# Patient Record
Sex: Female | Born: 1965 | Race: White | Hispanic: No | Smoking: Never smoker
Health system: Southern US, Community
[De-identification: ages and names within clinical notes are randomized; demographics above are authoritative.]

## PROBLEM LIST (undated history)

## (undated) DIAGNOSIS — K8020 Calculus of gallbladder without cholecystitis without obstruction: Secondary | ICD-10-CM

## (undated) DIAGNOSIS — D696 Thrombocytopenia, unspecified: Secondary | ICD-10-CM

## (undated) DIAGNOSIS — Z87442 Personal history of urinary calculi: Secondary | ICD-10-CM

## (undated) DIAGNOSIS — R0600 Dyspnea, unspecified: Secondary | ICD-10-CM

## (undated) DIAGNOSIS — M722 Plantar fascial fibromatosis: Secondary | ICD-10-CM

## (undated) DIAGNOSIS — E559 Vitamin D deficiency, unspecified: Secondary | ICD-10-CM

## (undated) DIAGNOSIS — K219 Gastro-esophageal reflux disease without esophagitis: Secondary | ICD-10-CM

## (undated) DIAGNOSIS — R7303 Prediabetes: Secondary | ICD-10-CM

## (undated) DIAGNOSIS — I10 Essential (primary) hypertension: Secondary | ICD-10-CM

## (undated) DIAGNOSIS — E669 Obesity, unspecified: Secondary | ICD-10-CM

## (undated) DIAGNOSIS — N3281 Overactive bladder: Secondary | ICD-10-CM

## (undated) DIAGNOSIS — E785 Hyperlipidemia, unspecified: Secondary | ICD-10-CM

## (undated) DIAGNOSIS — L987 Excessive and redundant skin and subcutaneous tissue: Secondary | ICD-10-CM

## (undated) DIAGNOSIS — C801 Malignant (primary) neoplasm, unspecified: Secondary | ICD-10-CM

## (undated) HISTORY — PX: PRE-MALIGNANT / BENIGN SKIN LESION EXCISION: SHX160

---

## 2004-02-12 ENCOUNTER — Other Ambulatory Visit: Payer: Self-pay

## 2013-12-06 ENCOUNTER — Encounter: Payer: Self-pay | Admitting: Podiatry

## 2013-12-10 ENCOUNTER — Encounter: Payer: Self-pay | Admitting: Podiatry

## 2013-12-10 ENCOUNTER — Ambulatory Visit (INDEPENDENT_AMBULATORY_CARE_PROVIDER_SITE_OTHER): Payer: BC Managed Care – PPO | Admitting: Podiatry

## 2013-12-10 ENCOUNTER — Ambulatory Visit (INDEPENDENT_AMBULATORY_CARE_PROVIDER_SITE_OTHER): Payer: BC Managed Care – PPO

## 2013-12-10 VITALS — BP 136/84 | HR 76 | Resp 16

## 2013-12-10 DIAGNOSIS — M79671 Pain in right foot: Secondary | ICD-10-CM

## 2013-12-10 DIAGNOSIS — M722 Plantar fascial fibromatosis: Secondary | ICD-10-CM

## 2013-12-10 DIAGNOSIS — M79672 Pain in left foot: Secondary | ICD-10-CM

## 2013-12-10 DIAGNOSIS — M79609 Pain in unspecified limb: Secondary | ICD-10-CM

## 2013-12-10 MED ORDER — METHYLPREDNISOLONE (PAK) 4 MG PO TABS: ORAL_TABLET | ORAL | Status: DC

## 2013-12-10 MED ORDER — MELOXICAM 15 MG PO TABS: 15.0000 mg | ORAL_TABLET | Freq: Every day | ORAL | Status: DC

## 2013-12-10 NOTE — Patient Instructions (Signed)
Plantar Fasciitis (Heel Spur Syndrome) with Rehab The plantar fascia is a fibrous, ligament-like, soft-tissue structure that spans the bottom of the foot. Plantar fasciitis is a condition that causes pain in the foot due to inflammation of the tissue. SYMPTOMS   Pain and tenderness on the underneath side of the foot.  Pain that worsens with standing or walking. CAUSES  Plantar fasciitis is caused by irritation and injury to the plantar fascia on the underneath side of the foot. Common mechanisms of injury include:  Direct trauma to bottom of the foot.  Damage to a small nerve that runs under the foot where the main fascia attaches to the heel bone.  Stress placed on the plantar fascia due to bone spurs. RISK INCREASES WITH:   Activities that place stress on the plantar fascia (running, jumping, pivoting, or cutting).  Poor strength and flexibility.  Improperly fitted shoes.  Tight calf muscles.  Flat feet.  Failure to warm-up properly before activity.  Obesity. PREVENTION  Warm up and stretch properly before activity.  Allow for adequate recovery between workouts.  Maintain physical fitness:  Strength, flexibility, and endurance.  Cardiovascular fitness.  Maintain a health body weight.  Avoid stress on the plantar fascia.  Wear properly fitted shoes, including arch supports for individuals who have flat feet. PROGNOSIS  If treated properly, then the symptoms of plantar fasciitis usually resolve without surgery. However, occasionally surgery is necessary. RELATED COMPLICATIONS   Recurrent symptoms that may result in a chronic condition.  Problems of the lower back that are caused by compensating for the injury, such as limping.  Pain or weakness of the foot during push-off following surgery.  Chronic inflammation, scarring, and partial or complete fascia tear, occurring more often from repeated injections. TREATMENT  Treatment initially involves the use of  ice and medication to help reduce pain and inflammation. The use of strengthening and stretching exercises may help reduce pain with activity, especially stretches of the Achilles tendon. These exercises may be performed at home or with a therapist. Your caregiver may recommend that you use heel cups of arch supports to help reduce stress on the plantar fascia. Occasionally, corticosteroid injections are given to reduce inflammation. If symptoms persist for greater than 6 months despite non-surgical (conservative), then surgery may be recommended.  MEDICATION   If pain medication is necessary, then nonsteroidal anti-inflammatory medications, such as aspirin and ibuprofen, or other minor pain relievers, such as acetaminophen, are often recommended.  Do not take pain medication within 7 days before surgery.  Prescription pain relievers may be given if deemed necessary by your caregiver. Use only as directed and only as much as you need.  Corticosteroid injections may be given by your caregiver. These injections should be reserved for the most serious cases, because they may only be given a certain number of times. HEAT AND COLD  Cold treatment (icing) relieves pain and reduces inflammation. Cold treatment should be applied for 10 to 15 minutes every 2 to 3 hours for inflammation and pain and immediately after any activity that aggravates your symptoms. Use ice packs or massage the area with a piece of ice (ice massage).  Heat treatment may be used prior to performing the stretching and strengthening activities prescribed by your caregiver, physical therapist, or athletic trainer. Use a heat pack or soak the injury in warm water. SEEK IMMEDIATE MEDICAL CARE IF:  Treatment seems to offer no benefit, or the condition worsens.  Any medications produce adverse side effects. EXERCISES RANGE   OF MOTION (ROM) AND STRETCHING EXERCISES - Plantar Fasciitis (Heel Spur Syndrome) These exercises may help you  when beginning to rehabilitate your injury. Your symptoms may resolve with or without further involvement from your physician, physical therapist or athletic trainer. While completing these exercises, remember:   Restoring tissue flexibility helps normal motion to return to the joints. This allows healthier, less painful movement and activity.  An effective stretch should be held for at least 30 seconds.  A stretch should never be painful. You should only feel a gentle lengthening or release in the stretched tissue. RANGE OF MOTION - Toe Extension, Flexion  Sit with your right / left leg crossed over your opposite knee.  Grasp your toes and gently pull them back toward the top of your foot. You should feel a stretch on the bottom of your toes and/or foot.  Hold this stretch for __________ seconds.  Now, gently pull your toes toward the bottom of your foot. You should feel a stretch on the top of your toes and or foot.  Hold this stretch for __________ seconds. Repeat __________ times. Complete this stretch __________ times per day.  RANGE OF MOTION - Ankle Dorsiflexion, Active Assisted  Remove shoes and sit on a chair that is preferably not on a carpeted surface.  Place right / left foot under knee. Extend your opposite leg for support.  Keeping your heel down, slide your right / left foot back toward the chair until you feel a stretch at your ankle or calf. If you do not feel a stretch, slide your bottom forward to the edge of the chair, while still keeping your heel down.  Hold this stretch for __________ seconds. Repeat __________ times. Complete this stretch __________ times per day.  STRETCH  Gastroc, Standing  Place hands on wall.  Extend right / left leg, keeping the front knee somewhat bent.  Slightly point your toes inward on your back foot.  Keeping your right / left heel on the floor and your knee straight, shift your weight toward the wall, not allowing your back to  arch.  You should feel a gentle stretch in the right / left calf. Hold this position for __________ seconds. Repeat __________ times. Complete this stretch __________ times per day. STRETCH  Soleus, Standing  Place hands on wall.  Extend right / left leg, keeping the other knee somewhat bent.  Slightly point your toes inward on your back foot.  Keep your right / left heel on the floor, bend your back knee, and slightly shift your weight over the back leg so that you feel a gentle stretch deep in your back calf.  Hold this position for __________ seconds. Repeat __________ times. Complete this stretch __________ times per day. STRETCH  Gastrocsoleus, Standing  Note: This exercise can place a lot of stress on your foot and ankle. Please complete this exercise only if specifically instructed by your caregiver.   Place the ball of your right / left foot on a step, keeping your other foot firmly on the same step.  Hold on to the wall or a rail for balance.  Slowly lift your other foot, allowing your body weight to press your heel down over the edge of the step.  You should feel a stretch in your right / left calf.  Hold this position for __________ seconds.  Repeat this exercise with a slight bend in your right / left knee. Repeat __________ times. Complete this stretch __________ times per day.    STRENGTHENING EXERCISES - Plantar Fasciitis (Heel Spur Syndrome)  These exercises may help you when beginning to rehabilitate your injury. They may resolve your symptoms with or without further involvement from your physician, physical therapist or athletic trainer. While completing these exercises, remember:   Muscles can gain both the endurance and the strength needed for everyday activities through controlled exercises.  Complete these exercises as instructed by your physician, physical therapist or athletic trainer. Progress the resistance and repetitions only as guided. STRENGTH - Towel  Curls  Sit in a chair positioned on a non-carpeted surface.  Place your foot on a towel, keeping your heel on the floor.  Pull the towel toward your heel by only curling your toes. Keep your heel on the floor.  If instructed by your physician, physical therapist or athletic trainer, add ____________________ at the end of the towel. Repeat __________ times. Complete this exercise __________ times per day. STRENGTH - Ankle Inversion  Secure one end of a rubber exercise band/tubing to a fixed object (table, pole). Loop the other end around your foot just before your toes.  Place your fists between your knees. This will focus your strengthening at your ankle.  Slowly, pull your big toe up and in, making sure the band/tubing is positioned to resist the entire motion.  Hold this position for __________ seconds.  Have your muscles resist the band/tubing as it slowly pulls your foot back to the starting position. Repeat __________ times. Complete this exercises __________ times per day.  Document Released: 12/13/2005 Document Revised: 03/06/2012 Document Reviewed: 03/27/2009 [employer] Patient Information 2014 [employer]. Plantar Fasciitis Plantar fasciitis is a common condition that causes foot pain. It is soreness (inflammation) of the band of tough fibrous tissue on the bottom of the foot that runs from the heel bone (calcaneus) to the ball of the foot. The cause of this soreness may be from excessive standing, poor fitting shoes, running on hard surfaces, being overweight, having an abnormal walk, or overuse (this is common in runners) of the painful foot or feet. It is also common in aerobic exercise dancers and ballet dancers. SYMPTOMS  Most people with plantar fasciitis complain of:  Severe pain in the morning on the bottom of their foot especially when taking the first steps out of bed. This pain recedes after a few minutes of walking.  Severe pain is experienced also during walking  following a long period of inactivity.  Pain is worse when walking barefoot or up stairs DIAGNOSIS   Your caregiver will diagnose this condition by examining and feeling your foot.  Special tests such as X-rays of your foot, are usually not needed. PREVENTION   Consult a sports medicine professional before beginning a new exercise program.  Walking programs offer a good workout. With walking there is a lower chance of overuse injuries common to runners. There is less impact and less jarring of the joints.  Begin all new exercise programs slowly. If problems or pain develop, decrease the amount of time or distance until you are at a comfortable level.  Wear good shoes and replace them regularly.  Stretch your foot and the heel cords at the back of the ankle (Achilles tendon) both before and after exercise.  Run or exercise on even surfaces that are not hard. For example, asphalt is better than pavement.  Do not run barefoot on hard surfaces.  If using a treadmill, vary the incline.  Do not continue to workout if you have foot or joint   problems. Seek professional help if they do not improve. HOME CARE INSTRUCTIONS   Avoid activities that cause you pain until you recover.  Use ice or cold packs on the problem or painful areas after working out.  Only take over-the-counter or prescription medicines for pain, discomfort, or fever as directed by your caregiver.  Soft shoe inserts or athletic shoes with air or gel sole cushions may be helpful.  If problems continue or become more severe, consult a sports medicine caregiver or your own health care provider. Cortisone is a potent anti-inflammatory medication that may be injected into the painful area. You can discuss this treatment with your caregiver. MAKE SURE YOU:   Understand these instructions.  Will watch your condition.  Will get help right away if you are not doing well or get worse. Document Released: 09/07/2001 Document  Revised: 03/06/2012 Document Reviewed: 11/06/2008 Exit[employer] Patient Information 2014 [employer].  

## 2013-12-10 NOTE — Progress Notes (Signed)
   Subjective:    Patient ID: Jasmine Franco, female    DOB: Mar 28, 1966, 47 y.o.   MRN: [id number]  HPI Comments: The bottom and top of feet hurt N throbbing pain   L bilateral feet plantar heel and top of foot  D 6 months  O gradual  C worse  A first step up after resting  T ibuprofen    Right great toenail possible fungus, it came off and grew back funny and now its the second toenail and seems to be thicker than the others  Foot Pain      Review of Systems  Constitutional:       Weight change  Musculoskeletal:       Difficulty walking   Skin:       Change in nails   Hematological: Bruises/bleeds easily.  All other systems reviewed and are negative.       Objective:   Physical Exam: I have reviewed her past medical history medications allergies surgeries social history and review of systems. Vital signs are stable she is alert and oriented x3. Pulses are strongly palpable bilateral. Deep tendon reflexes are palpable bilateral. Neurologic sensorium is intact per since once the monofilament. Muscle strength is 5 over 5 dorsiflexors plantar flexors inverters everters all intrinsic musculature is intact. Orthopedic evaluation demonstrates mild tenderness on palpation medial continued tubercles bilateral. Radiographic evaluation does demonstrate soft tissue increase in density at the plantar fascial calcaneal insertion site. Cutaneous evaluation demonstrates supple well hydrated cutis no erythema edema cellulitis drainage or odor.        Assessment & Plan:  Assessment: Plantar fasciitis bilateral.  Plan: We discussed the etiology pathology conservative versus surgical therapies. At this point she is allergic to cortisone so we did not inject her bilateral heels instead we applied a plantar fascial strapping to the bilateral foot. She was scanned for a pair orthotics. She was written a prescription for prednisone as well as Mobic. We discussed appropriate shoe gear  stretching exercises ice therapy and shoe gear modifications. I will followup with her in the near future appear

## 2014-01-15 ENCOUNTER — Telehealth: Payer: Self-pay | Admitting: *Deleted

## 2014-01-15 ENCOUNTER — Other Ambulatory Visit: Payer: Self-pay | Admitting: Podiatry

## 2014-01-15 MED ORDER — ETODOLAC ER 400 MG PO TB24: 400.0000 mg | ORAL_TABLET | Freq: Every day | ORAL | Status: DC

## 2014-01-15 NOTE — Telephone Encounter (Signed)
CALLED AND SPOKE WITH PT LETTING HER KNOW THAT THE ETODOLAC WAS SENT IN TO PHARMACY.

## 2014-01-15 NOTE — Telephone Encounter (Signed)
Pt called said she has been taking mobic for 1 month and it is not helping. Wants to go ahead and try something different such as etodolac sent [facility] [address]. Is this ok?

## 2014-02-04 ENCOUNTER — Encounter: Payer: Self-pay | Admitting: Podiatry

## 2014-06-10 ENCOUNTER — Other Ambulatory Visit: Payer: Self-pay | Admitting: *Deleted

## 2014-06-10 MED ORDER — ETODOLAC ER 400 MG PO TB24: 400.0000 mg | ORAL_TABLET | Freq: Every day | ORAL | Status: DC

## 2014-10-31 ENCOUNTER — Other Ambulatory Visit: Payer: Self-pay | Admitting: *Deleted

## 2014-10-31 MED ORDER — ETODOLAC ER 400 MG PO TB24: 400.0000 mg | ORAL_TABLET | Freq: Every day | ORAL | Status: DC

## 2014-10-31 NOTE — Telephone Encounter (Signed)
[facility] [address] sent refill request for etodolac 400 mg #30 with 3 refills. Per dr Milinda Pointer refill.

## 2015-03-03 ENCOUNTER — Other Ambulatory Visit: Payer: Self-pay | Admitting: *Deleted

## 2015-03-03 MED ORDER — ETODOLAC ER 400 MG PO TB24: 400.0000 mg | ORAL_TABLET | Freq: Every day | ORAL | Status: DC

## 2015-03-03 NOTE — Telephone Encounter (Signed)
Faxed refill request received. Refill medication , patient needs to follow up with Dr Milinda Pointer

## 2015-04-16 ENCOUNTER — Ambulatory Visit (INDEPENDENT_AMBULATORY_CARE_PROVIDER_SITE_OTHER): Payer: BLUE CROSS/BLUE SHIELD

## 2015-04-16 ENCOUNTER — Ambulatory Visit (INDEPENDENT_AMBULATORY_CARE_PROVIDER_SITE_OTHER): Payer: BLUE CROSS/BLUE SHIELD | Admitting: Podiatry

## 2015-04-16 VITALS — BP 161/102 | HR 78 | Resp 16 | Ht 64.0 in | Wt 235.0 lb

## 2015-04-16 DIAGNOSIS — M722 Plantar fascial fibromatosis: Secondary | ICD-10-CM

## 2015-04-16 DIAGNOSIS — M79673 Pain in unspecified foot: Secondary | ICD-10-CM

## 2015-04-16 MED ORDER — ETODOLAC ER 400 MG PO TB24: 400.0000 mg | ORAL_TABLET | Freq: Every day | ORAL | Status: DC

## 2015-04-16 NOTE — Progress Notes (Signed)
She presents today for follow-up of her capsulitis and fasciitis bilateral. She states that when she takes the etodolac she feels good and is able to get around and do what she wants to do.  Objective: Vital signs are stable she is alert and oriented 3. Pulses are palpable bilateral. Hallux valgus disorder is also noted she has pain on palpation second metatarsophalangeal joints bilaterally and pain on palpation plantar fascial calcaneal insertion site bilateral.  Assessment: Plantar fasciitis with compensatory capsulitis second metatarsophalangeal joint hallux valgus disorders also noted.  Plan: Discussed the etiology pathology conservative versus surgical therapies at this point I went ahead and refill her etodolac and will follow up with her on an as-needed basis for surgical intervention.

## 2015-06-23 ENCOUNTER — Other Ambulatory Visit: Payer: Self-pay | Admitting: Podiatry

## 2015-06-23 NOTE — Telephone Encounter (Signed)
Pt will need to schedule an appt if problems continue.

## 2015-09-24 ENCOUNTER — Other Ambulatory Visit: Payer: Self-pay | Admitting: *Deleted

## 2015-09-24 MED ORDER — ETODOLAC ER 400 MG PO TB24: ORAL_TABLET | ORAL | Status: DC

## 2015-09-24 NOTE — Telephone Encounter (Signed)
Etodolac refilled 

## 2015-12-11 ENCOUNTER — Telehealth: Payer: Self-pay | Admitting: *Deleted

## 2015-12-11 MED ORDER — ETODOLAC ER 400 MG PO TB24: ORAL_TABLET | ORAL | Status: DC

## 2015-12-11 NOTE — Telephone Encounter (Signed)
Fax refill request for Etodolac SA 400Mg .  Dr. Milinda Pointer ordered refill as previously. Done.

## 2016-03-26 ENCOUNTER — Telehealth: Payer: Self-pay | Admitting: *Deleted

## 2016-03-26 MED ORDER — ETODOLAC ER 400 MG PO TB24: ORAL_TABLET | ORAL | Status: DC

## 2016-03-26 NOTE — Telephone Encounter (Signed)
Faxed refill request for Etodolac SA.  Dr. Milinda Pointer states refill once to get pt in for an appt.

## 2016-04-24 ENCOUNTER — Other Ambulatory Visit: Payer: Self-pay | Admitting: Podiatry

## 2016-05-03 ENCOUNTER — Other Ambulatory Visit: Payer: Self-pay | Admitting: Podiatry

## 2016-05-04 ENCOUNTER — Telehealth: Payer: Self-pay | Admitting: *Deleted

## 2016-05-04 NOTE — Telephone Encounter (Signed)
Pt called for refill of the Lodine.  I explained to pt, the medication was refilled in 02/2016, and she had been instructed to make an appt if continuing to have pain.  Pt asked how much it would cost and I told her it would depend on the problem, her insurance and treatments needed. Pt states she may call back.

## 2016-05-26 ENCOUNTER — Telehealth: Payer: Self-pay | Admitting: *Deleted

## 2016-05-26 MED ORDER — ETODOLAC ER 400 MG PO TB24: ORAL_TABLET | ORAL | Status: DC

## 2016-05-26 NOTE — Telephone Encounter (Signed)
Marcy Siren I do not get into guessing how much a patient's visit is going to cost. I do not know what the doctor will decide to do and I am not going to have a patient say I said the visit would be. So that is not something I do. If it is custom orthotics, darco shoe or some product we know is not covered I will help with that, but not what a visit may cost before a patient see a doctor.Marland Kitchen

## 2016-05-26 NOTE — Telephone Encounter (Signed)
May give a 6 month refill but needs to be seen by [place] or pcp to reperscribe a that date.

## 2016-05-26 NOTE — Telephone Encounter (Addendum)
Pt request refill of Lodine.  Dr. Milinda Pointer states refill for 6 months and if additional are needed pt is to go to PCP or make an appt here.  Informed pt.

## 2016-11-14 ENCOUNTER — Other Ambulatory Visit: Payer: Self-pay | Admitting: Podiatry

## 2016-12-01 ENCOUNTER — Ambulatory Visit (INDEPENDENT_AMBULATORY_CARE_PROVIDER_SITE_OTHER): Payer: BLUE CROSS/BLUE SHIELD | Admitting: Podiatry

## 2016-12-01 VITALS — BP 147/86 | HR 71

## 2016-12-01 DIAGNOSIS — M7751 Other enthesopathy of right foot: Secondary | ICD-10-CM

## 2016-12-01 DIAGNOSIS — Q828 Other specified congenital malformations of skin: Secondary | ICD-10-CM | POA: Diagnosis not present

## 2016-12-01 DIAGNOSIS — M779 Enthesopathy, unspecified: Secondary | ICD-10-CM

## 2016-12-01 DIAGNOSIS — M778 Other enthesopathies, not elsewhere classified: Secondary | ICD-10-CM

## 2016-12-01 DIAGNOSIS — M722 Plantar fascial fibromatosis: Secondary | ICD-10-CM

## 2016-12-01 MED ORDER — ETODOLAC ER 400 MG PO TB24: ORAL_TABLET | ORAL | 4 refills | Status: DC

## 2016-12-01 NOTE — Progress Notes (Signed)
She presents today with chief complaint of a painful porokeratotic lesion to the plantar lateral aspect of the right foot as well as pain to the dorsal aspect of the foot..  Objective: Vital signs stable alert and oriented 3. She has pain on palpation myofascial pain insertion site of the right foot she has a painful porokeratotic lesion sub-fifth metatarsal base of the right foot.  Plan: Refilled her etodolac debrided the reactive hyperkeratotic lesion and placed Cantharone under occlusion to be washed off tomorrow. I will follow-up with her as needed.

## 2017-01-11 ENCOUNTER — Telehealth: Payer: Self-pay | Admitting: Podiatry

## 2017-01-11 MED ORDER — ETODOLAC ER 400 MG PO TB24: ORAL_TABLET | ORAL | 4 refills | Status: DC

## 2017-01-11 MED ORDER — ETODOLAC ER 400 MG PO TB24
ORAL_TABLET | ORAL | 4 refills | Status: DC
Start: 2017-01-11 — End: 2017-01-27

## 2017-01-11 NOTE — Addendum Note (Signed)
Addended by: Harriett Sine D on: 01/11/2017 03:15 PM   Modules accepted: Orders

## 2017-01-11 NOTE — Telephone Encounter (Signed)
Jasmine Franco said she has left several messages that her RX needs to be change to E strips and havent received a response. [facility].

## 2017-01-19 ENCOUNTER — Telehealth: Payer: Self-pay | Admitting: *Deleted

## 2017-01-19 NOTE — Telephone Encounter (Addendum)
Pt's message started with DOB, and phone number, both were given very quickly and pt did not leave a message.  01/19/2017-I spoke with pt and she said [facility] and if Dr.Hyatt could write her for something cheaper then she would like that because she is having trouble with her insurance. Pt states if he can't write for a cheaper medication then she will need the [facility]ac [facility] the [id number] called for the status of the Etodolac. I told her I did not get a cheaper medication and asked if she wanted me to [facility] [address] [facility] and she said yes.

## 2017-01-27 MED ORDER — ETODOLAC ER 400 MG PO TB24: ORAL_TABLET | ORAL | 4 refills | Status: DC

## 2018-03-01 ENCOUNTER — Telehealth: Payer: Self-pay | Admitting: Podiatry

## 2018-03-01 MED ORDER — ETODOLAC ER 400 MG PO TB24: ORAL_TABLET | ORAL | 0 refills | Status: DC

## 2018-03-01 NOTE — Addendum Note (Signed)
Addended by: Graceann Congress D on: 03/01/2018 11:52 AM   Modules accepted: Orders

## 2018-03-01 NOTE — Telephone Encounter (Signed)
patinet caleld requesting a refill on her anti imflammatory medicine. Would like for it to be sent to [facility] on [facility] [address]

## 2018-03-01 NOTE — Telephone Encounter (Signed)
Patient called requesting refill for Etodolac.  Patient has not been seen since Dec. 2017.  Patient informed that since she has not been seen in over 1 year that she will need to schedule follow up visit with Dr. Milinda Pointer.  One refill given today per Dr. Milinda Pointer and patient was sent to scheduling for follow up appt.

## 2018-03-15 ENCOUNTER — Encounter: Payer: Self-pay | Admitting: Podiatry

## 2018-03-15 ENCOUNTER — Ambulatory Visit (INDEPENDENT_AMBULATORY_CARE_PROVIDER_SITE_OTHER): Payer: Self-pay | Admitting: Podiatry

## 2018-03-15 DIAGNOSIS — M722 Plantar fascial fibromatosis: Secondary | ICD-10-CM

## 2018-03-15 MED ORDER — ETODOLAC ER 400 MG PO TB24: ORAL_TABLET | ORAL | 11 refills | Status: DC

## 2018-03-15 NOTE — Progress Notes (Signed)
She presents today for follow-up of her foot pain.  States that as long as she continues to take her etodolac once a day or every other day she does just fine.  Objective: No change in past medical history medications allergies surgery social history review of systems.  Assessment: Chronic pain bilateral foot.  Plan: Refill of her Relafen 400 mg 1 p.o. daily and I will follow-up with her as needed.

## 2018-11-07 ENCOUNTER — Emergency Department: Payer: Self-pay

## 2018-11-07 ENCOUNTER — Emergency Department
Admission: EM | Admit: 2018-11-07 | Discharge: 2018-11-07 | Disposition: A | Discharge status: Home / Self Care | Payer: Self-pay | Attending: Emergency Medicine | Admitting: Emergency Medicine

## 2018-11-07 ENCOUNTER — Other Ambulatory Visit: Payer: Self-pay

## 2018-11-07 DIAGNOSIS — Z79899 Other long term (current) drug therapy: Secondary | ICD-10-CM | POA: Insufficient documentation

## 2018-11-07 DIAGNOSIS — R1032 Left lower quadrant pain: Secondary | ICD-10-CM | POA: Insufficient documentation

## 2018-11-07 LAB — CBC
HCT: 39 % (ref 36.0–46.0)
Hemoglobin: 12.5 g/dL (ref 12.0–15.0)
MCH: 28.7 pg (ref 26.0–34.0)
MCHC: 32.1 g/dL (ref 30.0–36.0)
MCV: 89.4 fL (ref 80.0–100.0)
Platelets: 202 10*3/uL (ref 150–400)
RBC: 4.36 MIL/uL (ref 3.87–5.11)
RDW: 14.4 % (ref 11.5–15.5)
WBC: 5.4 10*3/uL (ref 4.0–10.5)
nRBC: 0 % (ref 0.0–0.2)

## 2018-11-07 LAB — URINALYSIS, COMPLETE (UACMP) WITH MICROSCOPIC
Bilirubin Urine: NEGATIVE
Glucose, UA: NEGATIVE mg/dL
Hgb urine dipstick: NEGATIVE
Ketones, ur: 5 mg/dL — AB
Leukocytes, UA: NEGATIVE
Nitrite: NEGATIVE
Protein, ur: NEGATIVE mg/dL
Specific Gravity, Urine: 1.021 (ref 1.005–1.030)
pH: 5 (ref 5.0–8.0)

## 2018-11-07 LAB — COMPREHENSIVE METABOLIC PANEL
ALT: 22 U/L (ref 0–44)
AST: 20 U/L (ref 15–41)
Albumin: 3.7 g/dL (ref 3.5–5.0)
Alkaline Phosphatase: 83 U/L (ref 38–126)
Anion gap: 6 (ref 5–15)
BUN: 17 mg/dL (ref 6–20)
CO2: 27 mmol/L (ref 22–32)
Calcium: 8.9 mg/dL (ref 8.9–10.3)
Chloride: 109 mmol/L (ref 98–111)
Creatinine, Ser: 0.55 mg/dL (ref 0.44–1.00)
GFR calc Af Amer: >60 mL/min (ref >60)
GFR calc non Af Amer: >60 mL/min (ref >60)
Glucose, Bld: 96 mg/dL (ref 70–99)
Potassium: 4 mmol/L (ref 3.5–5.1)
Sodium: 142 mmol/L (ref 135–145)
Total Bilirubin: 0.6 mg/dL (ref 0.3–1.2)
Total Protein: 7.1 g/dL (ref 6.5–8.1)

## 2018-11-07 LAB — LIPASE, BLOOD: Lipase: 33 U/L (ref 11–51)

## 2018-11-07 LAB — POCT PREGNANCY, URINE: Preg Test, Ur: NEGATIVE

## 2018-11-07 MED ORDER — IOPAMIDOL (ISOVUE-300) INJECTION 61%
100.0000 mL | Freq: Once | INTRAVENOUS | Status: AC | PRN
  Administered 2018-11-07: 100 mL via INTRAVENOUS
  Filled 2018-11-07: qty 100

## 2018-11-07 MED ORDER — IOPAMIDOL (ISOVUE-300) INJECTION 61%
30.0000 mL | Freq: Once | INTRAVENOUS | Status: AC | PRN
  Administered 2018-11-07: 30 mL via ORAL
  Filled 2018-11-07: qty 30

## 2018-11-07 MED ORDER — AMOXICILLIN-POT CLAVULANATE 875-125 MG PO TABS: 1.0000 | ORAL_TABLET | Freq: Two times a day (BID) | ORAL | 0 refills | Status: AC

## 2018-11-07 MED ORDER — TRAMADOL HCL 50 MG PO TABS: 50.0000 mg | ORAL_TABLET | Freq: Four times a day (QID) | ORAL | 0 refills | Status: DC | PRN

## 2018-11-07 MED ORDER — AMOXICILLIN-POT CLAVULANATE 875-125 MG PO TABS
1.0000 | ORAL_TABLET | Freq: Once | ORAL | Status: AC
  Administered 2018-11-07: 1 via ORAL
  Filled 2018-11-07: qty 1

## 2018-11-07 NOTE — Discharge Instructions (Signed)
Take your entire course of antibiotics.  Please take pain medication as needed but only as prescribed.  Do not drink alcohol or drive while taking your pain medication.  Please follow-up with your primary care doctor within the next 1 week for recheck/reevaluation.  Return to the emergency department for any worsening pain or development of fever.

## 2018-11-07 NOTE — ED Notes (Signed)
Pt c/o LLQ pain constantly for the past week. Denies N/V/D/fever or painful urination.

## 2018-11-07 NOTE — ED Provider Notes (Signed)
[facility] Emergency Department Provider Note  Time seen: 5:10 PM  I have reviewed the triage vital signs and the nursing notes.   HISTORY  Chief Complaint Abdominal Pain    HPI Jasmine Franco is a 52 y.o. female with no significant past medical history presents to the emergency department for left lower quadrant abdominal pain x1 week.  According to the patient for the past 1 week she has been experiencing a dull constant pain 8/10 in severity states it has been constant and non-wavering.  Denies any nausea or vomiting.  Denies any diarrhea.  Denies any dysuria or hematuria.   History reviewed. No pertinent past medical history.  There are no active problems to display for this patient.   History reviewed. No pertinent surgical history.  Prior to Admission medications   Medication Sig Start Date End Date Taking? Authorizing Provider  etodolac (LODINE XL) 400 MG 24 hr tablet TAKE 1 TABLET (400 MG TOTAL) BY MOUTH DAILY. 03/15/18   Hyatt, Max T, DPM    Allergies  Allergen Reactions  . Cortisone Other (See Comments)    redness    Family History  Problem Relation Age of Onset  . Diabetes Mother     Social History Social History   Tobacco Use  . Smoking status: Never Smoker  . Smokeless tobacco: Never Used  Substance Use Topics  . Alcohol use: No  . Drug use: No    Review of Systems Constitutional: Negative for fever. Eyes: Negative for visual complaints ENT: Negative for recent illness/congestion Cardiovascular: Negative for chest pain. Respiratory: Negative for shortness of breath. Gastrointestinal: Left lower quadrant abdominal pain.  Negative for vomiting or diarrhea.  Negative for black or bloody stool.  Normal bowel movements Genitourinary: Negative for urinary compaints Musculoskeletal: Negative for musculoskeletal complaints Skin: Negative for skin complaints  Neurological: Negative for headache All other ROS  negative  ____________________________________________   PHYSICAL EXAM:  VITAL SIGNS: ED Triage Vitals  Enc Vitals Group     BP 11/07/18 1523 (!) 191/97     Pulse Rate 11/07/18 1523 76     Resp 11/07/18 1523 16     Temp 11/07/18 1523 98 F (36.7 C)     Temp Source 11/07/18 1523 Oral     SpO2 11/07/18 1523 98 %     Weight 11/07/18 1522 273 lb (123.8 kg)     Height 11/07/18 1522 5\' 3"  (1.6 m)     Head Circumference --      Peak Flow --      Pain Score 11/07/18 1522 8     Pain Loc --      Pain Edu? --      Excl. in [place]? --     Constitutional: Alert and oriented. Well appearing and in no distress. Eyes: Normal exam ENT   Head: Normocephalic and atraumatic.   Nose: No congestion/rhinnorhea.   Mouth/Throat: Mucous membranes are moist. Cardiovascular: Normal rate, regular rhythm. No murmur Respiratory: Normal respiratory effort without tachypnea nor retractions. Breath sounds are clear  Gastrointestinal: Soft, mild left lower quadrant tenderness to palpation.  No rebound guarding or distention.  Abdomen otherwise benign Musculoskeletal: Nontender with normal range of motion in all extremities.  Neurologic:  Normal speech and language. No gross focal neurologic deficits Skin:  Skin is warm, dry and intact.  Psychiatric: Mood and affect are normal.   ____________________________________________   RADIOLOGY  CT scan shows extensive diverticulosis with no definitive diverticulitis  ____________________________________________  INITIAL IMPRESSION / ASSESSMENT AND PLAN / ED COURSE  Pertinent labs & imaging results that were available during my care of the patient were reviewed by me and considered in my medical decision making (see chart for details).  Patient presents to the emergency department for 1 week of left lower quadrant abdominal pain.  Differential would include hernia, colitis, diverticulitis, ovarian pathology, UTI pyelonephritis, muscular skeletal  pain.  Labs have resulted largely within normal limits, normal white blood cell count.  However given the patient's mild tenderness to palpation with ongoing pain x1 week despite home pain medications we will obtain a CT scan to further evaluate.  Patient agreeable to plan of care.  CT largely negative besides extensive diverticulosis, no definitive diverticulitis however patient's pain is focal to the left lower quadrant which would make diverticulitis a reasonable explanation for her discomfort.  As a precaution we will treat with Augmentin at the patient follow-up with her primary care doctor.  Patient agreeable to plan of care.  ____________________________________________   FINAL CLINICAL IMPRESSION(S) / ED DIAGNOSES  Left lower quadrant abdominal pain    Harvest Dark, MD 11/07/18 2212

## 2018-11-07 NOTE — ED Triage Notes (Addendum)
C/o LLQ abd pain x 1 week. Denise N&V&D&fever. No dysuria. Still have BM. A&O, in wheelchair. No distress noted. Brought over by [facility]

## 2019-03-21 ENCOUNTER — Ambulatory Visit (INDEPENDENT_AMBULATORY_CARE_PROVIDER_SITE_OTHER): Payer: Self-pay | Admitting: Podiatry

## 2019-03-21 DIAGNOSIS — M722 Plantar fascial fibromatosis: Secondary | ICD-10-CM

## 2019-03-21 MED ORDER — ETODOLAC ER 400 MG PO TB24: ORAL_TABLET | ORAL | 3 refills | Status: DC

## 2019-03-21 NOTE — Progress Notes (Signed)
Dr. Milinda Pointer informed patient refill would be sent into her pharmacy and appointment was not necessary for today.

## 2019-04-10 ENCOUNTER — Telehealth: Payer: Self-pay

## 2019-04-10 MED ORDER — ETODOLAC ER 400 MG PO TB24: ORAL_TABLET | ORAL | 3 refills | Status: DC

## 2019-04-10 NOTE — Telephone Encounter (Signed)
Patient called requesting her refill for Lodine to go to [facility], [address] instead of [facility].   Refill has been sent to [facility].

## 2020-04-02 ENCOUNTER — Telehealth: Payer: Self-pay | Admitting: *Deleted

## 2020-04-02 MED ORDER — ETODOLAC ER 400 MG PO TB24: ORAL_TABLET | ORAL | 1 refills | Status: DC

## 2020-04-02 NOTE — Addendum Note (Signed)
Addended by: Graceann Congress D on: 04/02/2020 01:30 PM   Modules accepted: Orders

## 2020-04-02 NOTE — Telephone Encounter (Signed)
I need a refill on my medication.

## 2020-04-02 NOTE — Telephone Encounter (Signed)
Please refill her medication.  I think she is requesting the lodine.

## 2020-04-02 NOTE — Telephone Encounter (Signed)
Patient has been notified of refill and medication has been sent to pharmacy

## 2020-04-23 ENCOUNTER — Ambulatory Visit: Payer: Self-pay | Admitting: Podiatry

## 2020-05-07 ENCOUNTER — Ambulatory Visit (INDEPENDENT_AMBULATORY_CARE_PROVIDER_SITE_OTHER): Payer: Self-pay | Admitting: Podiatry

## 2020-05-07 ENCOUNTER — Encounter: Payer: Self-pay | Admitting: Podiatry

## 2020-05-07 ENCOUNTER — Other Ambulatory Visit: Payer: Self-pay

## 2020-05-07 DIAGNOSIS — M722 Plantar fascial fibromatosis: Secondary | ICD-10-CM

## 2020-05-07 MED ORDER — ETODOLAC ER 400 MG PO TB24: ORAL_TABLET | ORAL | 3 refills | Status: DC

## 2020-05-07 NOTE — Progress Notes (Signed)
She presents today after having not seen her for couple years with chief complaint of pain in her feet.  She states that the medication etodolac usually will relieve it she has been taking one a day for the past couple of years.  Objective: Vital signs are stable she is alert and oriented x3.  There is no erythema edema cellulitis drainage or odor she has hallux valgus deformity with some osteoarthritic changes of the midfoot.  Mild pes planus is noted bilaterally.  Assessment: Osteoarthritis hallux valgus deformity.  Plan: Etodolac 400 mg one p.o. daily.  Ninety were dispensed with three refills follow-up with her in a year

## 2020-12-08 DIAGNOSIS — N3281 Overactive bladder: Secondary | ICD-10-CM | POA: Insufficient documentation

## 2020-12-08 DIAGNOSIS — M545 Low back pain, unspecified: Secondary | ICD-10-CM | POA: Insufficient documentation

## 2020-12-27 DIAGNOSIS — C801 Malignant (primary) neoplasm, unspecified: Secondary | ICD-10-CM

## 2020-12-27 HISTORY — DX: Malignant (primary) neoplasm, unspecified: C80.1

## 2021-01-26 ENCOUNTER — Other Ambulatory Visit: Payer: Self-pay | Admitting: Internal Medicine

## 2021-01-26 DIAGNOSIS — M25512 Pain in left shoulder: Secondary | ICD-10-CM

## 2021-01-26 DIAGNOSIS — R1032 Left lower quadrant pain: Secondary | ICD-10-CM

## 2021-01-26 DIAGNOSIS — G8929 Other chronic pain: Secondary | ICD-10-CM

## 2021-01-27 ENCOUNTER — Other Ambulatory Visit: Payer: Self-pay | Admitting: Internal Medicine

## 2021-01-27 DIAGNOSIS — Z1231 Encounter for screening mammogram for malignant neoplasm of breast: Secondary | ICD-10-CM

## 2021-01-27 HISTORY — PX: CHOLECYSTECTOMY: SHX55

## 2021-02-01 ENCOUNTER — Ambulatory Visit: Admission: RE | Admit: 2021-02-01 | Payer: Self-pay | Source: Ambulatory Visit

## 2021-02-09 ENCOUNTER — Ambulatory Visit: Admission: RE | Admit: 2021-02-09 | Payer: Self-pay | Source: Ambulatory Visit

## 2021-02-10 ENCOUNTER — Observation Stay: Payer: 59 | Admitting: Certified Registered"

## 2021-02-10 ENCOUNTER — Emergency Department: Payer: 59

## 2021-02-10 ENCOUNTER — Ambulatory Visit: Payer: Self-pay

## 2021-02-10 ENCOUNTER — Inpatient Hospital Stay
Admission: EM | Admit: 2021-02-10 | Discharge: 2021-02-12 | DRG: 419 | Disposition: A | Discharge status: Home / Self Care | Payer: 59 | Attending: Surgery | Admitting: Surgery

## 2021-02-10 ENCOUNTER — Other Ambulatory Visit: Payer: Self-pay

## 2021-02-10 ENCOUNTER — Encounter
Admission: EM | Disposition: A | Discharge status: Home / Self Care | Payer: Self-pay | Source: Home / Self Care | Attending: Surgery

## 2021-02-10 DIAGNOSIS — Z888 Allergy status to other drugs, medicaments and biological substances status: Secondary | ICD-10-CM

## 2021-02-10 DIAGNOSIS — Z20822 Contact with and (suspected) exposure to covid-19: Secondary | ICD-10-CM | POA: Diagnosis present

## 2021-02-10 DIAGNOSIS — K8000 Calculus of gallbladder with acute cholecystitis without obstruction: Secondary | ICD-10-CM | POA: Diagnosis not present

## 2021-02-10 DIAGNOSIS — M722 Plantar fascial fibromatosis: Secondary | ICD-10-CM | POA: Diagnosis present

## 2021-02-10 DIAGNOSIS — K819 Cholecystitis, unspecified: Secondary | ICD-10-CM | POA: Diagnosis not present

## 2021-02-10 DIAGNOSIS — R1013 Epigastric pain: Secondary | ICD-10-CM

## 2021-02-10 DIAGNOSIS — K8012 Calculus of gallbladder with acute and chronic cholecystitis without obstruction: Secondary | ICD-10-CM | POA: Diagnosis not present

## 2021-02-10 DIAGNOSIS — K8020 Calculus of gallbladder without cholecystitis without obstruction: Secondary | ICD-10-CM | POA: Diagnosis present

## 2021-02-10 DIAGNOSIS — Z833 Family history of diabetes mellitus: Secondary | ICD-10-CM

## 2021-02-10 DIAGNOSIS — N3281 Overactive bladder: Secondary | ICD-10-CM | POA: Diagnosis present

## 2021-02-10 DIAGNOSIS — I10 Essential (primary) hypertension: Secondary | ICD-10-CM | POA: Diagnosis present

## 2021-02-10 DIAGNOSIS — K810 Acute cholecystitis: Secondary | ICD-10-CM | POA: Diagnosis present

## 2021-02-10 LAB — CBC
HCT: 40.7 % (ref 36.0–46.0)
Hemoglobin: 13.3 g/dL (ref 12.0–15.0)
MCH: 29.4 pg (ref 26.0–34.0)
MCHC: 32.7 g/dL (ref 30.0–36.0)
MCV: 89.8 fL (ref 80.0–100.0)
Platelets: 203 10*3/uL (ref 150–400)
RBC: 4.53 MIL/uL (ref 3.87–5.11)
RDW: 14 % (ref 11.5–15.5)
WBC: 7.6 10*3/uL (ref 4.0–10.5)
nRBC: 0 % (ref 0.0–0.2)

## 2021-02-10 LAB — BASIC METABOLIC PANEL
Anion gap: 9 (ref 5–15)
BUN: 20 mg/dL (ref 6–20)
CO2: 27 mmol/L (ref 22–32)
Calcium: 9 mg/dL (ref 8.9–10.3)
Chloride: 105 mmol/L (ref 98–111)
Creatinine, Ser: 0.59 mg/dL (ref 0.44–1.00)
GFR, Estimated: >60 mL/min (ref >60)
Glucose, Bld: 128 mg/dL — ABNORMAL HIGH (ref 70–99)
Potassium: 4.4 mmol/L (ref 3.5–5.1)
Sodium: 141 mmol/L (ref 135–145)

## 2021-02-10 LAB — TROPONIN I (HIGH SENSITIVITY): Troponin I (High Sensitivity): 4 ng/L (ref <18)

## 2021-02-10 LAB — HEPATIC FUNCTION PANEL
ALT: 17 U/L (ref 0–44)
AST: 15 U/L (ref 15–41)
Albumin: 3.6 g/dL (ref 3.5–5.0)
Alkaline Phosphatase: 96 U/L (ref 38–126)
Bilirubin, Direct: <0.1 mg/dL (ref 0.0–0.2)
Total Bilirubin: 0.8 mg/dL (ref 0.3–1.2)
Total Protein: 7.4 g/dL (ref 6.5–8.1)

## 2021-02-10 LAB — RESP PANEL BY RT-PCR (FLU A&B, COVID) ARPGX2
Influenza A by PCR: NEGATIVE
Influenza B by PCR: NEGATIVE
SARS Coronavirus 2 by RT PCR: NEGATIVE

## 2021-02-10 LAB — LIPASE, BLOOD: Lipase: 35 U/L (ref 11–51)

## 2021-02-10 SURGERY — CHOLECYSTECTOMY, ROBOT-ASSISTED, LAPAROSCOPIC
Anesthesia: General

## 2021-02-10 MED ORDER — LORAZEPAM 2 MG/ML IJ SOLN: 1.0000 mg | Freq: Once | INTRAMUSCULAR | Status: DC | PRN

## 2021-02-10 MED ORDER — SODIUM CHLORIDE 0.9 % IV SOLN: Freq: Once | INTRAVENOUS | Status: AC

## 2021-02-10 MED ORDER — HYDROMORPHONE HCL 1 MG/ML IJ SOLN
INTRAMUSCULAR | Status: AC
  Administered 2021-02-10: 0.5 mg via INTRAVENOUS
  Filled 2021-02-10: qty 1

## 2021-02-10 MED ORDER — BUPIVACAINE-EPINEPHRINE (PF) 0.25% -1:200000 IJ SOLN
INTRAMUSCULAR | Status: AC
  Filled 2021-02-10: qty 30

## 2021-02-10 MED ORDER — ONDANSETRON HCL 4 MG/2ML IJ SOLN
INTRAMUSCULAR | Status: AC
  Filled 2021-02-10: qty 2

## 2021-02-10 MED ORDER — ROCURONIUM BROMIDE 100 MG/10ML IV SOLN
INTRAVENOUS | Status: DC | PRN
  Administered 2021-02-10: 40 mg via INTRAVENOUS

## 2021-02-10 MED ORDER — DEXAMETHASONE SODIUM PHOSPHATE 10 MG/ML IJ SOLN
INTRAMUSCULAR | Status: DC | PRN
  Administered 2021-02-10: 5 mg via INTRAVENOUS

## 2021-02-10 MED ORDER — PROPOFOL 10 MG/ML IV BOLUS
INTRAVENOUS | Status: AC
  Filled 2021-02-10: qty 20

## 2021-02-10 MED ORDER — FENTANYL CITRATE (PF) 100 MCG/2ML IJ SOLN
50.0000 ug | Freq: Once | INTRAMUSCULAR | Status: AC
  Administered 2021-02-10: 50 ug via INTRAVENOUS
  Filled 2021-02-10: qty 2

## 2021-02-10 MED ORDER — ONDANSETRON HCL 4 MG/2ML IJ SOLN
4.0000 mg | Freq: Four times a day (QID) | INTRAMUSCULAR | Status: DC | PRN
  Administered 2021-02-10: 4 mg via INTRAVENOUS
  Filled 2021-02-10: qty 2

## 2021-02-10 MED ORDER — ONDANSETRON HCL 4 MG/2ML IJ SOLN
4.0000 mg | Freq: Once | INTRAMUSCULAR | Status: AC
  Administered 2021-02-10: 4 mg via INTRAVENOUS
  Filled 2021-02-10: qty 2

## 2021-02-10 MED ORDER — OXYBUTYNIN CHLORIDE ER 5 MG PO TB24
5.0000 mg | ORAL_TABLET | Freq: Every day | ORAL | Status: DC
  Administered 2021-02-11 – 2021-02-12 (×2): 5 mg via ORAL
  Filled 2021-02-10 (×2): qty 1

## 2021-02-10 MED ORDER — ACETAMINOPHEN 500 MG PO TABS
1000.0000 mg | ORAL_TABLET | Freq: Four times a day (QID) | ORAL | Status: DC
  Administered 2021-02-10 – 2021-02-12 (×7): 1000 mg via ORAL
  Filled 2021-02-10 (×7): qty 2

## 2021-02-10 MED ORDER — MEPERIDINE HCL 50 MG/ML IJ SOLN: 6.2500 mg | INTRAMUSCULAR | Status: DC | PRN

## 2021-02-10 MED ORDER — FENTANYL CITRATE (PF) 100 MCG/2ML IJ SOLN
INTRAMUSCULAR | Status: AC
  Filled 2021-02-10: qty 2

## 2021-02-10 MED ORDER — ROCURONIUM BROMIDE 10 MG/ML (PF) SYRINGE
PREFILLED_SYRINGE | INTRAVENOUS | Status: AC
  Filled 2021-02-10: qty 10

## 2021-02-10 MED ORDER — MIDAZOLAM HCL 2 MG/2ML IJ SOLN
INTRAMUSCULAR | Status: DC | PRN
  Administered 2021-02-10: 2 mg via INTRAVENOUS

## 2021-02-10 MED ORDER — PROMETHAZINE HCL 25 MG/ML IJ SOLN: 6.2500 mg | INTRAMUSCULAR | Status: DC | PRN

## 2021-02-10 MED ORDER — PROPOFOL 10 MG/ML IV BOLUS
INTRAVENOUS | Status: DC | PRN
  Administered 2021-02-10: 200 mg via INTRAVENOUS

## 2021-02-10 MED ORDER — PIPERACILLIN-TAZOBACTAM 3.375 G IVPB 30 MIN
3.3750 g | Freq: Once | INTRAVENOUS | Status: AC
  Administered 2021-02-10: 3.375 g via INTRAVENOUS
  Filled 2021-02-10: qty 50

## 2021-02-10 MED ORDER — OXYCODONE HCL 5 MG PO TABS
5.0000 mg | ORAL_TABLET | ORAL | Status: DC | PRN
  Administered 2021-02-10: 10 mg via ORAL
  Administered 2021-02-11: 5 mg via ORAL
  Administered 2021-02-11 (×2): 10 mg via ORAL
  Filled 2021-02-10 (×3): qty 2
  Filled 2021-02-10: qty 1

## 2021-02-10 MED ORDER — DROPERIDOL 2.5 MG/ML IJ SOLN
0.6250 mg | Freq: Once | INTRAMUSCULAR | Status: DC | PRN
  Filled 2021-02-10: qty 2

## 2021-02-10 MED ORDER — DEXAMETHASONE SODIUM PHOSPHATE 10 MG/ML IJ SOLN
INTRAMUSCULAR | Status: AC
  Filled 2021-02-10: qty 1

## 2021-02-10 MED ORDER — KETOROLAC TROMETHAMINE 30 MG/ML IJ SOLN: 15.0000 mg | Freq: Four times a day (QID) | INTRAMUSCULAR | Status: DC | PRN

## 2021-02-10 MED ORDER — SUCCINYLCHOLINE CHLORIDE 200 MG/10ML IV SOSY
PREFILLED_SYRINGE | INTRAVENOUS | Status: AC
  Filled 2021-02-10: qty 10

## 2021-02-10 MED ORDER — ONDANSETRON 4 MG PO TBDP: 4.0000 mg | ORAL_TABLET | Freq: Four times a day (QID) | ORAL | Status: DC | PRN

## 2021-02-10 MED ORDER — FENTANYL CITRATE (PF) 100 MCG/2ML IJ SOLN
INTRAMUSCULAR | Status: DC | PRN
  Administered 2021-02-10: 25 ug via INTRAVENOUS
  Administered 2021-02-10: 100 ug via INTRAVENOUS
  Administered 2021-02-10: 25 ug via INTRAVENOUS

## 2021-02-10 MED ORDER — PIPERACILLIN-TAZOBACTAM 3.375 G IVPB
3.3750 g | Freq: Three times a day (TID) | INTRAVENOUS | Status: DC
  Administered 2021-02-10 – 2021-02-12 (×5): 3.375 g via INTRAVENOUS
  Filled 2021-02-10 (×5): qty 50

## 2021-02-10 MED ORDER — LIDOCAINE HCL (CARDIAC) PF 100 MG/5ML IV SOSY
PREFILLED_SYRINGE | INTRAVENOUS | Status: DC | PRN
  Administered 2021-02-10: 100 mg via INTRAVENOUS

## 2021-02-10 MED ORDER — OXYCODONE HCL 5 MG PO TABS: 5.0000 mg | ORAL_TABLET | Freq: Once | ORAL | Status: DC | PRN

## 2021-02-10 MED ORDER — SODIUM CHLORIDE 0.9 % IV BOLUS
500.0000 mL | Freq: Once | INTRAVENOUS | Status: AC
  Administered 2021-02-10: 500 mL via INTRAVENOUS

## 2021-02-10 MED ORDER — HYDROMORPHONE HCL 1 MG/ML IJ SOLN
0.2500 mg | INTRAMUSCULAR | Status: DC | PRN
  Administered 2021-02-10 (×2): 0.5 mg via INTRAVENOUS

## 2021-02-10 MED ORDER — MIDAZOLAM HCL 2 MG/2ML IJ SOLN
INTRAMUSCULAR | Status: AC
  Filled 2021-02-10: qty 2

## 2021-02-10 MED ORDER — BUPIVACAINE-EPINEPHRINE (PF) 0.25% -1:200000 IJ SOLN
INTRAMUSCULAR | Status: DC | PRN
  Administered 2021-02-10: 30 mL via PERINEURAL

## 2021-02-10 MED ORDER — OXYCODONE HCL 5 MG/5ML PO SOLN: 5.0000 mg | Freq: Once | ORAL | Status: DC | PRN

## 2021-02-10 MED ORDER — HYDROMORPHONE HCL 1 MG/ML IJ SOLN
0.5000 mg | INTRAMUSCULAR | Status: DC | PRN
Start: 2021-02-10 — End: 2021-02-12
  Administered 2021-02-10: 0.5 mg via INTRAVENOUS
  Filled 2021-02-10: qty 0.5

## 2021-02-10 MED ORDER — HYDROMORPHONE HCL 1 MG/ML IJ SOLN: 0.5000 mg | INTRAMUSCULAR | Status: DC | PRN

## 2021-02-10 MED ORDER — SUCCINYLCHOLINE CHLORIDE 20 MG/ML IJ SOLN
INTRAMUSCULAR | Status: DC | PRN
  Administered 2021-02-10: 140 mg via INTRAVENOUS

## 2021-02-10 MED ORDER — INDOCYANINE GREEN 25 MG IV SOLR
2.5000 mg | INTRAVENOUS | Status: AC
  Administered 2021-02-10: 2.5 mg via INTRAVENOUS

## 2021-02-10 MED ORDER — SUGAMMADEX SODIUM 500 MG/5ML IV SOLN
INTRAVENOUS | Status: AC
  Filled 2021-02-10: qty 5

## 2021-02-10 MED ORDER — SODIUM CHLORIDE 0.9 % IV SOLN: INTRAVENOUS | Status: DC

## 2021-02-10 MED ORDER — PHENYLEPHRINE HCL (PRESSORS) 10 MG/ML IV SOLN
INTRAVENOUS | Status: DC | PRN
  Administered 2021-02-10: 100 ug via INTRAVENOUS

## 2021-02-10 SURGICAL SUPPLY — 59 items
BAG INFUSER PRESSURE 100CC (MISCELLANEOUS) ×2 IMPLANT
BULB RESERV EVAC DRAIN JP 100C (MISCELLANEOUS) ×2 IMPLANT
CANISTER SUCT 1200ML W/VALVE (MISCELLANEOUS) IMPLANT
CANNULA REDUC XI 12-8 STAPL (CANNULA) ×2
CANNULA REDUCER 12-8 DVNC XI (CANNULA) ×1 IMPLANT
CHLORAPREP W/TINT 26 (MISCELLANEOUS) ×2 IMPLANT
CLIP VESOLOCK MED LG 6/CT (CLIP) ×2 IMPLANT
COVER TIP SHEARS 8 DVNC (MISCELLANEOUS) ×1 IMPLANT
COVER TIP SHEARS 8MM DA VINCI (MISCELLANEOUS) ×2
COVER WAND RF STERILE (DRAPES) ×2 IMPLANT
CUP MEDICINE 2OZ PLAST GRAD ST (MISCELLANEOUS) ×2 IMPLANT
DECANTER SPIKE VIAL GLASS SM (MISCELLANEOUS) ×2 IMPLANT
DEFOGGER SCOPE WARMER CLEARIFY (MISCELLANEOUS) ×2 IMPLANT
DERMABOND ADVANCED (GAUZE/BANDAGES/DRESSINGS) ×1
DERMABOND ADVANCED .7 DNX12 (GAUZE/BANDAGES/DRESSINGS) ×1 IMPLANT
DRAIN CHANNEL JP 19F (MISCELLANEOUS) ×2 IMPLANT
DRAPE ARM DVNC X/XI (DISPOSABLE) ×4 IMPLANT
DRAPE COLUMN DVNC XI (DISPOSABLE) ×1 IMPLANT
DRAPE DA VINCI XI ARM (DISPOSABLE) ×8
DRAPE DA VINCI XI COLUMN (DISPOSABLE) ×2
DRSG TEGADERM 4X4.75 (GAUZE/BANDAGES/DRESSINGS) ×2 IMPLANT
ELECT CAUTERY BLADE TIP 2.5 (TIP) ×2
ELECT REM PT RETURN 9FT ADLT (ELECTROSURGICAL) ×2
ELECTRODE CAUTERY BLDE TIP 2.5 (TIP) ×1 IMPLANT
ELECTRODE REM PT RTRN 9FT ADLT (ELECTROSURGICAL) ×1 IMPLANT
GLOVE SURG SYN 7.0 (GLOVE) ×4 IMPLANT
GLOVE SURG SYN 7.5  E (GLOVE) ×4
GLOVE SURG SYN 7.5 E (GLOVE) ×2 IMPLANT
GOWN STRL REUS W/ TWL LRG LVL3 (GOWN DISPOSABLE) ×4 IMPLANT
GOWN STRL REUS W/TWL LRG LVL3 (GOWN DISPOSABLE) ×8
IRRIGATOR SUCT 8 DISP DVNC XI (IRRIGATION / IRRIGATOR) ×1 IMPLANT
IRRIGATOR SUCTION 8MM XI DISP (IRRIGATION / IRRIGATOR) ×2
IV NS 1000ML (IV SOLUTION) ×2
IV NS 1000ML BAXH (IV SOLUTION) ×1 IMPLANT
KIT PINK PAD W/HEAD ARE REST (MISCELLANEOUS) ×2
KIT PINK PAD W/HEAD ARM REST (MISCELLANEOUS) ×1 IMPLANT
LABEL OR SOLS (LABEL) ×2 IMPLANT
MANIFOLD NEPTUNE II (INSTRUMENTS) ×2 IMPLANT
NEEDLE HYPO 22GX1.5 SAFETY (NEEDLE) ×2 IMPLANT
NS IRRIG 500ML POUR BTL (IV SOLUTION) ×2 IMPLANT
OBTURATOR OPTICAL STANDARD 8MM (TROCAR) ×2
OBTURATOR OPTICAL STND 8 DVNC (TROCAR) ×1
OBTURATOR OPTICALSTD 8 DVNC (TROCAR) ×1 IMPLANT
PACK LAP CHOLECYSTECTOMY (MISCELLANEOUS) ×2 IMPLANT
PENCIL ELECTRO HAND CTR (MISCELLANEOUS) ×2 IMPLANT
POUCH SPECIMEN RETRIEVAL 10MM (ENDOMECHANICALS) ×2 IMPLANT
SEAL CANN UNIV 5-8 DVNC XI (MISCELLANEOUS) ×4 IMPLANT
SEAL XI 5MM-8MM UNIVERSAL (MISCELLANEOUS) ×8
SET TUBE SMOKE EVAC HIGH FLOW (TUBING) ×2 IMPLANT
SOLUTION ELECTROLUBE (MISCELLANEOUS) ×2 IMPLANT
SPONGE LAP 18X18 RF (DISPOSABLE) ×2 IMPLANT
SPONGE LAP 4X18 RFD (DISPOSABLE) ×2 IMPLANT
SPONGE VERSALON 4X4 4PLY (MISCELLANEOUS) ×2 IMPLANT
STAPLER CANNULA SEAL DVNC XI (STAPLE) ×1 IMPLANT
STAPLER CANNULA SEAL XI (STAPLE) ×2
SUT MNCRL AB 4-0 PS2 18 (SUTURE) ×2 IMPLANT
SUT VICRYL 0 AB UR-6 (SUTURE) ×4 IMPLANT
TAPE TRANSPORE STRL 2 31045 (GAUZE/BANDAGES/DRESSINGS) ×2 IMPLANT
TROCAR BALLN GELPORT 12X130M (ENDOMECHANICALS) ×2 IMPLANT

## 2021-02-10 NOTE — ED Provider Notes (Signed)
[facility] Emergency Department Provider Note   ____________________________________________   Event Date/Time   First MD Initiated Contact with Patient 02/10/21 [phone]     (approximate)  I have reviewed the triage vital signs and the nursing notes.   HISTORY  Chief Complaint Abdominal Pain    HPI Jasmine Franco is a 55 y.o. female who presents to the ED from home with a chief complaint of abdominal pain.  Patient states she awoke yesterday with upper abdominal pain which began in her left upper quadrant now radiating to her epigastrium and right upper quadrant.  Denies associated fever, chills, nausea, vomiting or diarrhea.  Denies cough, chest pain or shortness of breath.     Past medical history Hypertension Overactive bladder Chronic left shoulder pain Back pain Plantar fasciitis  Past surgical history None  There are no problems to display for this patient.   History reviewed. No pertinent surgical history.  Prior to Admission medications   Medication Sig Start Date End Date Taking? Authorizing Provider  etodolac (LODINE XL) 400 MG 24 hr tablet TAKE 1 TABLET (400 MG TOTAL) BY MOUTH DAILY. 05/07/20   Hyatt, Max T, DPM  ibuprofen (ADVIL) 200 MG tablet Take by mouth.    [provider]    Allergies Cortisone  Family History  Problem Relation Age of Onset  . Diabetes Mother     Social History Social History   Tobacco Use  . Smoking status: Never Smoker  . Smokeless tobacco: Never Used  Substance Use Topics  . Alcohol use: No  . Drug use: No    Review of Systems  Constitutional: No fever/chills Eyes: No visual changes. ENT: No sore throat. Cardiovascular: Denies chest pain. Respiratory: Denies shortness of breath. Gastrointestinal: Positive for abdominal pain.  No nausea, no vomiting.  No diarrhea.  No constipation. Genitourinary: Negative for dysuria. Musculoskeletal: Negative for back pain. Skin: Negative for  rash. Neurological: Negative for headaches, focal weakness or numbness.   ____________________________________________   PHYSICAL EXAM:  VITAL SIGNS: ED Triage Vitals  Enc Vitals Group     BP 02/10/21 0251 (!) 170/99     Pulse Rate 02/10/21 0251 100     Resp 02/10/21 0251 16     Temp 02/10/21 0251 97.9 F (36.6 C)     Temp Source 02/10/21 0251 Oral     SpO2 02/10/21 0251 99 %     Weight 02/10/21 0253 250 lb (113.4 kg)     Height 02/10/21 0253 5\' 4"  (1.626 m)     Head Circumference --      Peak Flow --      Pain Score 02/10/21 0252 10     Pain Loc --      Pain Edu? --      Excl. in [place]? --     Constitutional: Alert and oriented. Well appearing and in mild acute distress. Eyes: Conjunctivae are normal. PERRL. EOMI. Head: Atraumatic. Nose: No congestion/rhinnorhea. Mouth/Throat: Mucous membranes are moist.   Neck: No stridor.   Cardiovascular: Normal rate, regular rhythm. Grossly normal heart sounds.  Good peripheral circulation. Respiratory: Normal respiratory effort.  No retractions. Lungs CTAB. Gastrointestinal: Soft and mildly tender to palpation epigastrium and right upper quadrant without rebound or guarding. No distention. No abdominal bruits. No CVA tenderness. Musculoskeletal: No lower extremity tenderness nor edema.  No joint effusions. Neurologic:  Normal speech and language. No gross focal neurologic deficits are appreciated. No gait instability. Skin:  Skin is warm, dry and  intact. No rash noted. Psychiatric: Mood and affect are normal. Speech and behavior are normal.  ____________________________________________   LABS (all labs ordered are listed, but only abnormal results are displayed)  Labs Reviewed  BASIC METABOLIC PANEL - Abnormal; Notable for the following components:      Result Value   Glucose, Bld 128 (*)    All other components within normal limits  CBC  HEPATIC FUNCTION PANEL  LIPASE, BLOOD  TROPONIN I (HIGH SENSITIVITY)    ____________________________________________  EKG  ED ECG REPORT I, Faithlynn Deeley J, the attending physician, personally viewed and interpreted this ECG.   Date: 02/10/2021  EKG Time: 0252  Rate: 84  Rhythm: normal EKG, normal sinus rhythm  Axis: Normal  Intervals:none  ST&T Change: Nonspecific  ____________________________________________  RADIOLOGY I, Dijuan Sleeth J, personally viewed and evaluated these images (plain radiographs) as part of my medical decision making, as well as reviewing the written report by the radiologist.  ED MD interpretation: No acute cardiopulmonary process, [place] pending  Official radiology report(s): DG Chest 2 View  Result Date: 02/10/2021 CLINICAL DATA:  Chest and epigastric pain. EXAM: CHEST - 2 VIEW COMPARISON:  None. FINDINGS: Lung volumes are low.The cardiomediastinal contours are normal. Pulmonary vasculature is normal. No consolidation, pleural effusion, or pneumothorax. No acute osseous abnormalities are seen. IMPRESSION: Low lung volumes without acute chest finding. Electronically Signed   By: Keith Rake M.D.   On: 02/10/2021 03:21    ____________________________________________   PROCEDURES  Procedure(s) performed (including Critical Care):  Procedures   ____________________________________________   INITIAL IMPRESSION / ASSESSMENT AND PLAN / ED COURSE  As part of my medical decision making, I reviewed the following data within the [facility] notes reviewed and incorporated, Labs reviewed, EKG interpreted, Old chart reviewed, Radiograph reviewed and Notes from prior ED visits     55 year old female presenting with upper abdominal pain. Differential diagnosis includes, but is not limited to, biliary disease (biliary colic, acute cholecystitis, cholangitis, choledocholithiasis, etc), intrathoracic causes for epigastric abdominal pain including ACS, gastritis, duodenitis, pancreatitis, small bowel or large  bowel obstruction, abdominal aortic aneurysm, hernia, and ulcer(s).  Laboratory results unremarkable.  Will administer IV analgesia and antiemetic, and proceed with right upper quadrant abdominal ultrasound to evaluate for cholecystitis.  Clinical Course as of 02/10/21 0701  Tue Feb 10, 2021  0701 Care transferred to Dr. Archie Balboa at change of shift pending results of ultrasound. [JS]    Clinical Course User Index [JS] Paulette Blanch, MD     ____________________________________________   FINAL CLINICAL IMPRESSION(S) / ED DIAGNOSES  Final diagnoses:  Epigastric pain     ED Discharge Orders    None      *Please note:  LESIA MONICA was evaluated in Emergency Department on 02/10/2021 for the symptoms described in the history of present illness. She was evaluated in the context of the global COVID-19 pandemic, which necessitated consideration that the patient might be at risk for infection with the SARS-CoV-2 virus that causes COVID-19. Institutional protocols and algorithms that pertain to the evaluation of patients at risk for COVID-19 are in a state of rapid change based on information released by regulatory bodies including the CDC and federal and state organizations. These policies and algorithms were followed during the patient's care in the ED.  Some ED evaluations and interventions may be delayed as a result of limited staffing during and the pandemic.*   Note:  This document was prepared using Systems analyst and  may include unintentional dictation errors.   Paulette Blanch, MD 02/10/21 [phone]

## 2021-02-10 NOTE — Anesthesia Procedure Notes (Signed)
Procedure Name: Intubation Performed by: Fredderick Phenix, CRNA Pre-anesthesia Checklist: Patient identified, Emergency Drugs available, Suction available and Patient being monitored Patient Re-evaluated:Patient Re-evaluated prior to induction Oxygen Delivery Method: Circle system utilized Preoxygenation: Pre-oxygenation with 100% oxygen Induction Type: IV induction Ventilation: Mask ventilation without difficulty Laryngoscope Size: Mac and 4 Grade View: Grade I Tube type: Oral Number of attempts: 1 Airway Equipment and Method: Stylet and Oral airway Placement Confirmation: ETT inserted through vocal cords under direct vision,  positive ETCO2 and breath sounds checked- equal and bilateral Secured at: 21 cm Tube secured with: Tape Dental Injury: Teeth and Oropharynx as per pre-operative assessment

## 2021-02-10 NOTE — Anesthesia Postprocedure Evaluation (Signed)
Anesthesia Post Note  Patient: Jasmine Franco  Procedure(s) Performed: XI ROBOTIC ASSISTED LAPAROSCOPIC CHOLECYSTECTOMY (N/A )  Patient location during evaluation: PACU Anesthesia Type: General Level of consciousness: awake and alert Pain management: pain level controlled Vital Signs Assessment: post-procedure vital signs reviewed and stable Respiratory status: spontaneous breathing, nonlabored ventilation, respiratory function stable and patient connected to nasal cannula oxygen Cardiovascular status: blood pressure returned to baseline and stable Postop Assessment: no apparent nausea or vomiting Anesthetic complications: no   No complications documented.   Last Vitals:  Vitals:   02/10/21 1630 02/10/21 1705  BP: 136/86 132/76  Pulse: 74 73  Resp: (!) 22 16  Temp: (!) 36.1 C 36.9 C  SpO2: 98% 97%    Last Pain:  Vitals:   02/10/21 1705  TempSrc: Oral  PainSc:                  Martha Clan

## 2021-02-10 NOTE — Op Note (Signed)
Procedure Date:  02/10/2021  Pre-operative Diagnosis:  Acute cholecystitis  Post-operative Diagnosis:  Acute cholecystitis  Procedure:  Robotic assisted cholecystectomy with ICG FireFly cholangiogram  Surgeon:  Melvyn Neth, MD  Assistant:  Deirdre Peer, PA-S  Anesthesia:  General endotracheal  Estimated Blood Loss:  100 ml  Specimens:  gallbladder  Complications:  None  Findings:  There was significant inflammatory response around the gallbladder, with adhesions of the omentum to the gallbladder and liver.  The cystic duct and artery were identified without any injury.  There was spillage of bile and gallstone which was thoroughly suctioned and irrigated.  19 Fr. Blake drain left in RUQ.  Indications for Procedure:  This is a 55 y.o. female who presents with abdominal pain and workup revealing acute cholecystitis.  The benefits, complications, treatment options, and expected outcomes were discussed with the patient. The risks of bleeding, infection, recurrence of symptoms, failure to resolve symptoms, bile duct damage, bile duct leak, retained common bile duct stone, bowel injury, and need for further procedures were all discussed with the patient and she was willing to proceed.  Description of Procedure: The patient was correctly identified in the preoperative area and brought into the operating room.  The patient was placed supine with VTE prophylaxis in place.  Appropriate time-outs were performed.  Anesthesia was induced and the patient was intubated.  Appropriate antibiotics were infused.  The abdomen was prepped and draped in a sterile fashion. A supraumbilical incision was made. A cutdown technique was used to enter the abdominal cavity without injury, and a 12 mm robotic port was inserted.  Pneumoperitoneum was obtained with appropriate opening pressures.  Three 8-mm ports were placed in the mid abdomen at the level of the umbilicus under direct visualization.  The DaVinci  platform was docked, camera targeted, and instruments were placed under direct visualization.  Upon initial inspection, the omentum was thoroughly adhered to the gallbladder and surrounding liver as part of an inflammatory response. After initial dissection at the dome of the gallbladder, it was identified.  The fundus was grasped and retracted cephalad.  Adhesions were lysed bluntly and with electrocautery all around the gallbladder and surrounding liver.  In doing this, there was some oozing from all the raw surfaces. The infundibulum was grasped and retracted laterally.  The peritoneum surrounding the gallbladder was incised with electrocautery and extended on either side of the gallbladder.  After further dissection, the cystic duct was identified.  FireFly cholangiogram was then obtained, and the duct would not light up, but the common bile duct was clearly visible and always protected.  The cystic duct was dissected, clipped proximally and distally, and cut in between.  Initially, the cystic artery could not be identified and there was no plane to dissect the gallbladder laterally or medially.  Instead, it was decided to proceed with a top-down approach.  Cautery was used to dissect at the dome of the gallbladder, and following it down towards the cystic duct.  The were two tears in the gallbladder while doing this, and there was bile spillage and a large gallstone spilled as well.  After further dissection, the cystic artery was then identified, clipped proximally and distally, and cut in between.  The neck of thegallbladder was taken from the gallbladder fossa, freeing the entire gallbladder at that point. The gallbladder was placed in an Endocatch bag. The liver bed was inspected and any bleeding was controlled with electrocautery. The right upper quadrant was then inspected again revealing intact  clips, no bleeding, and no ductal injury.  The area was thoroughly irrigated.  The DaVinci platform was  then undocked.  A 19 Fr. Blake drain was inserted via the right lateral port site and placed in the RUQ.  The 8 mm ports were removed under direct visualization and the 12 mm port was removed.  The Endocatch bag was brought out via the umbilical incision. The fascial opening was closed using 0 vicryl suture.  Local anesthetic was infused in all incisions and the incisions were closed with 4-0 Monocryl.  The wounds were cleaned and sealed with DermaBond.  The drain was secured to the skin using a 3-0 Nylon suture, and the drain was dressed with 4x4 gauze and TegaDerm.  The patient was emerged from anesthesia and extubated and brought to the recovery room for further management.  The patient tolerated the procedure well and all counts were correct at the end of the case.   Melvyn Neth, MD

## 2021-02-10 NOTE — Anesthesia Preprocedure Evaluation (Signed)
Anesthesia Evaluation  Patient identified by MRN, date of birth, ID band Patient awake    Reviewed: Allergy & Precautions, H&P , NPO status , Patient's Chart, lab work & pertinent test results  Airway Mallampati: III       Dental  (+) Chipped, Poor Dentition   Pulmonary neg pulmonary ROS,    Pulmonary exam normal breath sounds clear to auscultation       Cardiovascular negative cardio ROS Normal cardiovascular exam Rhythm:Regular Rate:Normal     Neuro/Psych negative neurological ROS  negative psych ROS   GI/Hepatic negative GI ROS, Neg liver ROS,   Endo/Other  negative endocrine ROS  Renal/GU negative Renal ROS  negative genitourinary   Musculoskeletal negative musculoskeletal ROS (+)   Abdominal   Peds negative pediatric ROS (+)  Hematology negative hematology ROS (+)   Anesthesia Other Findings History reviewed. No pertinent past medical history.   Reproductive/Obstetrics negative OB ROS                             Anesthesia Physical Anesthesia Plan  ASA: III  Anesthesia Plan: General   Post-op Pain Management:    Induction: Intravenous  PONV Risk Score and Plan: 3 and Ondansetron, Dexamethasone and Treatment may vary due to age or medical condition  Airway Management Planned: Oral ETT  Additional Equipment:   Intra-op Plan:   Post-operative Plan: Extubation in OR  Informed Consent: I have reviewed the patients History and Physical, chart, labs and discussed the procedure including the risks, benefits and alternatives for the proposed anesthesia with the patient or authorized representative who has indicated his/her understanding and acceptance.     Dental advisory given  Plan Discussed with: CRNA, Anesthesiologist and Surgeon  Anesthesia Plan Comments:         Anesthesia Quick Evaluation

## 2021-02-10 NOTE — ED Triage Notes (Signed)
Pt endorses 10/10 epigastric pain that now has radiated to RUQ of abdomen starting yesterday that has gotten progressively worse. Denies N/V/D or constipation. Denies urinary symptoms. Pt tearful in triage. Pt states she attempted OTC antacids without relief. Denies past surgeries. Hypertensive in triage at 170/99, denies history.

## 2021-02-10 NOTE — H&P (Signed)
[facility] SURGICAL HISTORY & PHYSICAL (cpt [id number])  HISTORY OF PRESENT ILLNESS (HPI):  55 y.o. female presented to [facility] ED early this morning for abdominal pain. Patient reports she first noticed the acute onset of epigastric abdominal pain yesterday morning. She reports that she thought maybe it was indigestion from eating fried bean dip and chicken wings for the Super Bowl but her typical antacid treatments were not relieving the pain. The pain was constant and she got no relief. No associated fever, chills, juandice, CP, SOB, nausea, emesis, urinary changes, or bowel changes. No history of similar pain in the past. No previous intra-abdominal surgeries. Laboratory work up in the Ed was reassuring. RUQ [place] however did show a 2.3 cm stone in the neck of the gallbladder.   General surgery is consulted by emergency medicine physician Dr Gilberto Better, MD for evaluation and management of symptomatic cholelithiasis    PAST MEDICAL HISTORY (PM[facility]):  History reviewed. No pertinent past medical history.  Reviewed. Otherwise negative.   PAST SURGICAL HISTORY ([place]):  History reviewed. No pertinent surgical history.  Reviewed. Otherwise negative.   MEDICATIONS:  Prior to Admission medications   Medication Sig Start Date End Date Taking? Authorizing Provider  etodolac (LODINE XL) 400 MG 24 hr tablet TAKE 1 TABLET (400 MG TOTAL) BY MOUTH DAILY. 05/07/20   Hyatt, Max T, DPM  ibuprofen (ADVIL) 200 MG tablet Take by mouth.    [provider]     ALLERGIES:  Allergies  Allergen Reactions  . Cortisone Other (See Comments)    redness     SOCIAL HISTORY:  Social History   Socioeconomic History  . Marital status: Married    Spouse name: Not on file  . Number of children: Not on file  . Years of education: Not on file  . Highest education level: Not on file  Occupational History  . Not on file  Tobacco Use  . Smoking status: Never Smoker  . Smokeless tobacco: Never  Used  Substance and Sexual Activity  . Alcohol use: No  . Drug use: No  . Sexual activity: Not on file  Other Topics Concern  . Not on file  Social History Narrative  . Not on file   Social Determinants of Health   Financial Resource Strain: Not on file  Food Insecurity: Not on file  Transportation Needs: Not on file  Physical Activity: Not on file  Stress: Not on file  Social Connections: Not on file  Intimate Partner Violence: Not on file     FAMILY HISTORY:  Family History  Problem Relation Age of Onset  . Diabetes Mother     Otherwise negative.   REVIEW OF SYSTEMS:  Review of Systems  Constitutional: Negative for chills and fever.  HENT: Negative for congestion and sore throat.   Respiratory: Negative for cough and shortness of breath.   Cardiovascular: Negative for chest pain and palpitations.  Gastrointestinal: Positive for abdominal pain. Negative for diarrhea, nausea and vomiting.  Genitourinary: Negative for dysuria and urgency.  All other systems reviewed and are negative.   VITAL SIGNS:  Temp:  [97.9 F (36.6 C)-99 F (37.2 C)] 99 F (37.2 C) (02/15 0345) Pulse Rate:  [64-100] 65 (02/15 0830) Resp:  [16-20] 19 (02/15 0800) BP: (143-170)/(86-99) 154/91 (02/15 0830) SpO2:  [99 %-100 %] 100 % (02/15 0830) Weight:  [113.4 kg] 113.4 kg (02/15 0253)     Height: 5\' 4"  (162.6 cm) Weight: 113.4 kg BMI (Calculated): 42.89   PHYSICAL  EXAM:  Physical Exam Vitals and nursing note reviewed. Exam conducted with a chaperone present.  Constitutional:      General: She is not in acute distress.    Appearance: She is well-developed. She is obese. She is not ill-appearing.  HENT:     Head: Normocephalic and atraumatic.  Eyes:     General: No scleral icterus.    Extraocular Movements: Extraocular movements intact.  Cardiovascular:     Rate and Rhythm: Normal rate and regular rhythm.     Heart sounds: Normal heart sounds. No murmur heard.   Pulmonary:      Effort: Pulmonary effort is normal. No respiratory distress.     Breath sounds: Normal breath sounds.  Abdominal:     General: There is no distension.     Palpations: Abdomen is soft.     Tenderness: There is abdominal tenderness in the right upper quadrant and epigastric area. There is no guarding or rebound.  Genitourinary:    Comments: Deferred Skin:    General: Skin is warm and dry.     Coloration: Skin is not jaundiced or pale.  Neurological:     General: No focal deficit present.     Mental Status: She is alert and oriented to person, place, and time.  Psychiatric:        Mood and Affect: Mood normal.        Behavior: Behavior normal.     INTAKE/OUTPUT:  This shift: No intake/output data recorded.  Last 2 shifts: @IOLAST2SHIFTS @  Labs:  CBC Latest Ref Rng & Units 02/10/2021 11/07/2018  WBC 4.0 - 10.5 K/uL 7.6 5.4  Hemoglobin 12.0 - 15.0 g/dL 13.3 12.5  Hematocrit 36.0 - 46.0 % 40.7 39.0  Platelets 150 - 400 K/uL 203 202   CMP Latest Ref Rng & Units 02/10/2021 11/07/2018  Glucose 70 - 99 mg/dL 128(H) 96  BUN 6 - 20 mg/dL 20 17  Creatinine 0.44 - 1.00 mg/dL 0.59 0.55  Sodium 135 - 145 mmol/L 141 142  Potassium 3.5 - 5.1 mmol/L 4.4 4.0  Chloride 98 - 111 mmol/L 105 109  CO2 22 - 32 mmol/L 27 27  Calcium 8.9 - 10.3 mg/dL 9.0 8.9  Total Protein 6.5 - 8.1 g/dL 7.4 7.1  Total Bilirubin 0.3 - 1.2 mg/dL 0.8 0.6  Alkaline Phos 38 - 126 U/L 96 83  AST 15 - 41 U/L 15 20  ALT 0 - 44 U/L 17 22     Imaging studies:   RUQ [place] (02/10/2021) personally reviewed which shows large gallstone in the neck of the gallbladder without significant changes concerning for acute cholecystitis, and radiologist report reviewed below:  IMPRESSION: 2.3 cm gallstone noted lodged in the gallbladder neck. Gallbladder wall thickness 3.9 mm. Positive Murphy sign. Findings consistent with cholelithiasis with cholecystitis. No biliary distention.    Assessment/Plan: (ICD-10's: K35.20) 55 y.o.  female with RUQ abdominal pain found to have a large stone in neck of her gallbladder concerning for likely symptomatic cholelithiasis without gross evidence of cholecystitis.   - Will plan on admission to general surgery service  - Plan for robotic assisted laparoscopic cholecystectomy with Dr Hampton Abbot this afternoon pending OR/Anesthesia availability.   - All risks, benefits, and alternatives to above elective procedure(s) were discussed with the patient and her daughter via phone, all of their questions were answered to their expressed satisfaction, patient expresses she wishes to proceed, and informed consent was obtained.  - NPO + IVF Resuscitation   - Prophylactic ABx (Zosyn)  -  Monitor abdominal examination  - Pain control prn; antiemetics prn   - DVT prophylaxis; hold for OR  All of the above findings and recommendations were discussed with the patient and her daughter via telephone, and all of their questions were answered to their expressed satisfaction.  -- Edison Simon, PA-C [facility] 02/10/2021, 9:16 AM [phone] M-F: 7am - 4pm

## 2021-02-10 NOTE — Transfer of Care (Signed)
Immediate Anesthesia Transfer of Care Note  Patient: Jasmine Franco  Procedure(s) Performed: XI ROBOTIC ASSISTED LAPAROSCOPIC CHOLECYSTECTOMY (N/A )  Patient Location: PACU  Anesthesia Type:General  Level of Consciousness: awake and alert   Airway & Oxygen Therapy: Patient Spontanous Breathing and Patient connected to face mask oxygen  Post-op Assessment: Report given to RN and Post -op Vital signs reviewed and stable  Post vital signs: Reviewed and stable  Last Vitals:  Vitals Value Taken Time  BP 146/88 02/10/21 1531  Temp 36.7 C 02/10/21 1530  Pulse 75 02/10/21 1534  Resp 16 02/10/21 1534  SpO2 100 % 02/10/21 1534  Vitals shown include unvalidated device data.  Last Pain:  Vitals:   02/10/21 1530  TempSrc:   PainSc: 8       Patients Stated Pain Goal: 0 (41/28/20 8138)  Complications: No complications documented.

## 2021-02-10 NOTE — ED Notes (Signed)
Pt transported to 227 at this time by RN Colletta [place]

## 2021-02-11 DIAGNOSIS — Z833 Family history of diabetes mellitus: Secondary | ICD-10-CM | POA: Diagnosis not present

## 2021-02-11 DIAGNOSIS — K810 Acute cholecystitis: Secondary | ICD-10-CM | POA: Diagnosis present

## 2021-02-11 DIAGNOSIS — I10 Essential (primary) hypertension: Secondary | ICD-10-CM | POA: Diagnosis present

## 2021-02-11 DIAGNOSIS — K819 Cholecystitis, unspecified: Secondary | ICD-10-CM | POA: Diagnosis present

## 2021-02-11 DIAGNOSIS — N3281 Overactive bladder: Secondary | ICD-10-CM | POA: Diagnosis present

## 2021-02-11 DIAGNOSIS — K8000 Calculus of gallbladder with acute cholecystitis without obstruction: Secondary | ICD-10-CM | POA: Diagnosis present

## 2021-02-11 DIAGNOSIS — Z888 Allergy status to other drugs, medicaments and biological substances status: Secondary | ICD-10-CM | POA: Diagnosis not present

## 2021-02-11 DIAGNOSIS — M722 Plantar fascial fibromatosis: Secondary | ICD-10-CM | POA: Diagnosis present

## 2021-02-11 DIAGNOSIS — Z20822 Contact with and (suspected) exposure to covid-19: Secondary | ICD-10-CM | POA: Diagnosis present

## 2021-02-11 LAB — COMPREHENSIVE METABOLIC PANEL
ALT: 35 U/L (ref 0–44)
AST: 42 U/L — ABNORMAL HIGH (ref 15–41)
Albumin: 2.8 g/dL — ABNORMAL LOW (ref 3.5–5.0)
Alkaline Phosphatase: 70 U/L (ref 38–126)
Anion gap: 11 (ref 5–15)
BUN: 14 mg/dL (ref 6–20)
CO2: 23 mmol/L (ref 22–32)
Calcium: 8.3 mg/dL — ABNORMAL LOW (ref 8.9–10.3)
Chloride: 105 mmol/L (ref 98–111)
Creatinine, Ser: 0.63 mg/dL (ref 0.44–1.00)
GFR, Estimated: >60 mL/min (ref >60)
Glucose, Bld: 107 mg/dL — ABNORMAL HIGH (ref 70–99)
Potassium: 4.2 mmol/L (ref 3.5–5.1)
Sodium: 139 mmol/L (ref 135–145)
Total Bilirubin: 1.3 mg/dL — ABNORMAL HIGH (ref 0.3–1.2)
Total Protein: 5.9 g/dL — ABNORMAL LOW (ref 6.5–8.1)

## 2021-02-11 LAB — CBC
HCT: 36.7 % (ref 36.0–46.0)
Hemoglobin: 11.7 g/dL — ABNORMAL LOW (ref 12.0–15.0)
MCH: 29.3 pg (ref 26.0–34.0)
MCHC: 31.9 g/dL (ref 30.0–36.0)
MCV: 92 fL (ref 80.0–100.0)
Platelets: 137 10*3/uL — ABNORMAL LOW (ref 150–400)
RBC: 3.99 MIL/uL (ref 3.87–5.11)
RDW: 13.9 % (ref 11.5–15.5)
WBC: 7.3 10*3/uL (ref 4.0–10.5)
nRBC: 0 % (ref 0.0–0.2)

## 2021-02-11 LAB — HIV ANTIBODY (ROUTINE TESTING W REFLEX): HIV Screen 4th Generation wRfx: NONREACTIVE

## 2021-02-11 MED ORDER — PANTOPRAZOLE SODIUM 40 MG PO TBEC
40.0000 mg | DELAYED_RELEASE_TABLET | Freq: Every day | ORAL | Status: DC
  Administered 2021-02-11: 40 mg via ORAL
  Filled 2021-02-11: qty 1

## 2021-02-11 NOTE — Progress Notes (Signed)
[place] Hospital Day(s): 0.   Post op day(s): 1 Day Post-Op.   Interval History:  Patient seen and examined No acute events or new complaints overnight.  Patient reports she is feeling okay but has had some nausea and an episode of emesis overnight She does have incisional soreness but her pain is overall improved compared to presentation No fever or chills Her labs remain reassuring and she is without leukocytosis, renal function normal, no significant electrolyte derangements.  Surgical drain with 30 ccs out; serosanguinous She has been on CLD but not eating much secondary to nausea   Vital signs in last 24 hours: [min-max] current  Temp:  [97 F (36.1 C)-99.2 F (37.3 C)] 97.6 F (36.4 C) (02/16 0508) Pulse Rate:  [68-84] 73 (02/16 0508) Resp:  [13-29] 20 (02/16 0508) BP: (113-155)/(56-88) 121/66 (02/16 0508) SpO2:  [91 %-100 %] 94 % (02/16 0508) Weight:  [113.4 kg] 113.4 kg (02/15 1150)     Height: 5\' 4"  (162.6 cm) Weight: 113.4 kg BMI (Calculated): 42.89   Intake/Output last 2 shifts:  02/15 0701 - 02/16 0700 In: 1500 [I.V.:1500] Out: 230 [Drains:30; Blood:200]   Physical Exam:  Constitutional: alert, cooperative and no distress  Respiratory: breathing non-labored at rest  Cardiovascular: regular rate and sinus rhythm  Gastrointestinal: Soft, incisional soreness, non-distended, no rebound/guarding, surgical drain in right mid-abdomen with serosanguinous output Integumentary: Laparoscopic incisions are CDI with dermabond, there is some ecchymosis, no erythema or drainage   Labs:  CBC Latest Ref Rng & Units 02/11/2021 02/10/2021 11/07/2018  WBC 4.0 - 10.5 K/uL 7.3 7.6 5.4  Hemoglobin 12.0 - 15.0 g/dL 11.7(L) 13.3 12.5  Hematocrit 36.0 - 46.0 % 36.7 40.7 39.0  Platelets 150 - 400 K/uL 137(L) 203 202   CMP Latest Ref Rng & Units 02/11/2021 02/10/2021 11/07/2018  Glucose 70 - 99 mg/dL 107(H) 128(H) 96  BUN 6 - 20 mg/dL 14 20  17   Creatinine 0.44 - 1.00 mg/dL 0.63 0.59 0.55  Sodium 135 - 145 mmol/L 139 141 142  Potassium 3.5 - 5.1 mmol/L 4.2 4.4 4.0  Chloride 98 - 111 mmol/L 105 105 109  CO2 22 - 32 mmol/L 23 27 27   Calcium 8.9 - 10.3 mg/dL 8.3(L) 9.0 8.9  Total Protein 6.5 - 8.1 g/dL 5.9(L) 7.4 7.1  Total Bilirubin 0.3 - 1.2 mg/dL 1.3(H) 0.8 0.6  Alkaline Phos 38 - 126 U/L 70 96 83  AST 15 - 41 U/L 42(H) 15 20  ALT 0 - 44 U/L 35 17 22    Imaging studies: No new pertinent imaging studies   Assessment/Plan:  55 y.o. female 1 Day Post-Op s/p robotic assisted laparoscopic cholecystectomy for acute cholecystitis    - Continue CLD for now; once feeling better we can ADAT   - Continue IVF resuscitation; wean as diet advances  - Continue IV ABx (Zosyn)  - Monitor abdominal examination; on-going bowel function  - Pain control prn; antiemetics prn   - Mobilization as tolerated    - Discharge Planning: Hopefully home tomorrow morning    All of the above findings and recommendations were discussed with the patient, patient's family (daughter at bedside), and the medical team, and all of patient's and family's questions were answered to their expressed satisfaction.  -- Edison Simon, PA-C [facility] 02/11/2021, 9:39 AM [phone] M-F: 7am - 4pm

## 2021-02-12 LAB — SURGICAL PATHOLOGY

## 2021-02-12 MED ORDER — IBUPROFEN 800 MG PO TABS: 800.0000 mg | ORAL_TABLET | Freq: Three times a day (TID) | ORAL | 0 refills | Status: DC | PRN

## 2021-02-12 MED ORDER — OXYCODONE HCL 5 MG PO TABS: 5.0000 mg | ORAL_TABLET | Freq: Four times a day (QID) | ORAL | 0 refills | Status: DC | PRN

## 2021-02-12 MED ORDER — AMOXICILLIN-POT CLAVULANATE 875-125 MG PO TABS: 1.0000 | ORAL_TABLET | Freq: Two times a day (BID) | ORAL | 0 refills | Status: AC

## 2021-02-12 NOTE — Discharge Summary (Signed)
[facility] SURGICAL DISCHARGE SUMMARY  Patient ID: Jasmine Franco MRN: [id number] DOB/AGE: September 09, 1966 55 y.o.  Admit date: 02/10/2021 Discharge date: 02/12/2021  Discharge Diagnoses Patient Active Problem List   Diagnosis Date Noted  . Acute cholecystitis 02/11/2021  . Symptomatic cholelithiasis 02/10/2021  . Cholecystitis     Consultants None  Procedures 02/10/2021:  Robotic assisted laparoscopic cholecystectomy  HPI: 55 y.o. female presented to [facility] ED early this morning for abdominal pain. Patient reports she first noticed the acute onset of epigastric abdominal pain yesterday morning. She reports that she thought maybe it was indigestion from eating fried bean dip and chicken wings for the Super Bowl but her typical antacid treatments were not relieving the pain. The pain was constant and she got no relief. No associated fever, chills, juandice, CP, SOB, nausea, emesis, urinary changes, or bowel changes. No history of similar pain in the past. No previous intra-abdominal surgeries. Laboratory work up in the Ed was reassuring. RUQ [place] however did show a 2.3 cm stone in the neck of the gallbladder.    Hospital Course: Informed consent was obtained and documented, and patient underwent uneventful robotic assisted laparoscopic cholecystectomy (Dr Hampton Abbot, 02/10/2021). Her gallbladder was very challenging intra-operatively. Post-operatively, she did have issues with nausea day 1 but this ultimately resolved. Advancement of patient's diet and ambulation were well-tolerated. The remainder of patient's hospital course was essentially unremarkable, and discharge planning was initiated accordingly with patient safely able to be discharged home with appropriate discharge instructions, antibiotics (augmentin x10 days), pain control, and outpatient follow-up after all of her questions were answered to her expressed satisfaction.   Discharge Condition: Good   Physical  Examination:  Constitutional: alert, cooperative and no distress  Respiratory: breathing non-labored at rest  Cardiovascular: regular rate and sinus rhythm  Gastrointestinal: Soft, incisional soreness, non-distended, no rebound/guarding, surgical drain in right mid-abdomen with serosanguinous output Integumentary: Laparoscopic incisions are CDI with dermabond, there is some ecchymosis, no erythema or drainage    Allergies as of 02/12/2021      Reactions   Cortisone Other (See Comments)   redness      Medication List    TAKE these medications   amoxicillin-clavulanate 875-125 MG tablet Commonly known as: Augmentin Take 1 tablet by mouth 2 (two) times daily for 10 days.   desonide 0.05 % cream Commonly known as: DESOWEN Apply topically 2 (two) times daily.   etodolac 400 MG 24 hr tablet Commonly known as: LODINE XL TAKE 1 TABLET (400 MG TOTAL) BY MOUTH DAILY.   ibuprofen 800 MG tablet Commonly known as: ADVIL Take 1 tablet (800 mg total) by mouth every 8 (eight) hours as needed.   oxybutynin 5 MG 24 hr tablet Commonly known as: DITROPAN-XL Take 5 mg by mouth daily.   oxyCODONE 5 MG immediate release tablet Commonly known as: Oxy IR/ROXICODONE Take 1 tablet (5 mg total) by mouth every 6 (six) hours as needed for moderate pain or severe pain.         Follow-up Information    Olean Ree, MD. Schedule an appointment as soon as possible for a visit on 02/16/2021.   Specialty: General Surgery Why: s/p lap chole, has drain Contact information: [address] [place] [address] [phone]                Time spent on discharge management including discussion of hospital course, clinical condition, outpatient instructions, prescriptions, and follow up with the patient and members of the medical  team: >30 minutes  -- Edison Simon , PA-C [facility]  02/12/2021, 9:25 AM [phone] M-F: 7am - 4pm

## 2021-02-16 ENCOUNTER — Other Ambulatory Visit: Payer: Self-pay

## 2021-02-16 ENCOUNTER — Ambulatory Visit (INDEPENDENT_AMBULATORY_CARE_PROVIDER_SITE_OTHER): Payer: 59 | Admitting: Surgery

## 2021-02-16 ENCOUNTER — Encounter: Payer: Self-pay | Admitting: Surgery

## 2021-02-16 VITALS — BP 166/90 | HR 81 | Temp 98.2°F | Ht 64.0 in | Wt 257.2 lb

## 2021-02-16 DIAGNOSIS — K810 Acute cholecystitis: Secondary | ICD-10-CM

## 2021-02-16 NOTE — Progress Notes (Signed)
02/16/2021  HPI: Jasmine Franco is a 55 y.o. female s/p robotic assisted cholecystectomy on 2/15.  Blake drain left in place due to significant inflammation.  Patient presents for follow up.  Has been doing well.  Denies any worsening pain, nausea or vomiting.  Tolerating diet, having bowel function.  Pain at the drain site itself.  Has remained serosanguinous with < 30 ml per day.  Vital signs: BP (!) 166/90   Pulse 81   Temp 98.2 F (36.8 C) (Oral)   Ht 5\' 4"  (1.683 m)   Wt 257 lb 3.2 oz (116.7 kg)   SpO2 97%   BMI 44.15 kg/m    Physical Exam: Constitutional: No acute distress Abdomen:  Soft, non-distended, appropriately sore to palpation.  Incisions healing well.  There is surrounding ecchymosis which is much improved compared to prior to discharge.  Drain with serosanguinous fluid.  Assessment/Plan: This is a 55 y.o. female s/p robotic assisted cholecystectomy.  --Drain removed at bedside.  No complications. --Reminded of activity restrictions. --Follow up in 2-3 weeks.   Melvyn Neth, [facility]

## 2021-02-16 NOTE — Patient Instructions (Addendum)
Today, we removed your drain. See follow up appointment below.  GENERAL POST-OPERATIVE PATIENT INSTRUCTIONS   WOUND CARE INSTRUCTIONS:  Keep a dry clean dressing on the wound if there is drainage. The initial bandage may be removed after 24 hours.  Once the wound has quit draining you may leave it open to air.  If clothing rubs against the wound or causes irritation and the wound is not draining you may cover it with a dry dressing during the daytime.  Try to keep the wound dry and avoid ointments on the wound unless directed to do so.  If the wound becomes bright red and painful or starts to drain infected material that is not clear, please contact your physician immediately.  If the wound is mildly pink and has a thick firm ridge underneath it, this is normal, and is referred to as a healing ridge.  This will resolve over the next 4-6 weeks.  BATHING: You may shower if you have been informed of this by your surgeon. However, Please do not submerge in a tub, hot tub, or pool until incisions are completely sealed or have been told by your surgeon that you may do so.  DIET:  You may eat any foods that you can tolerate.  It is a good idea to eat a high fiber diet and take in plenty of fluids to prevent constipation.  If you do become constipated you may want to take a mild laxative or take ducolax tablets on a daily basis until your bowel habits are regular.  Constipation can be very uncomfortable, along with straining, after recent surgery.  ACTIVITY:  You are encouraged to cough and deep breath or use your incentive spirometer if you were given one, every 15-30 minutes when awake.  This will help prevent respiratory complications and low grade fevers post-operatively if you had a general anesthetic.  You may want to hug a pillow when coughing and sneezing to add additional support to the surgical area, if you had abdominal or chest surgery, which will decrease pain during these times.  You are encouraged  to walk and engage in light activity for the next two weeks.  You should not lift more than 20 pounds, for 4 weeks as it could put you at increased risk for complications.  Twenty pounds is roughly equivalent to a plastic bag of groceries. At that time- Listen to your body when lifting, if you have pain when lifting, stop and then try again in a few days. Soreness after doing exercises or activities of daily living is normal as you get back in to your normal routine.  MEDICATIONS:  Try to take narcotic medications and anti-inflammatory medications, such as tylenol, ibuprofen, naprosyn, etc., with food.  This will minimize stomach upset from the medication.  Should you develop nausea and vomiting from the pain medication, or develop a rash, please discontinue the medication and contact your physician.  You should not drive, make important decisions, or operate machinery when taking narcotic pain medication.  SUNBLOCK Use sun block to incision area over the next year if this area will be exposed to sun. This helps decrease scarring and will allow you avoid a permanent darkened area over your incision.      Gallbladder Eating Plan If you have a gallbladder condition, you may have trouble digesting fats. Eating a low-fat diet can help reduce your symptoms, and may be helpful before and after having surgery to remove your gallbladder (cholecystectomy). Your health care provider  may recommend that you work with a diet and nutrition specialist (dietitian) to help you reduce the amount of fat in your diet. What are tips for following this plan? General guidelines  Limit your fat intake to less than 30% of your total daily calories. If you eat around 1,800 calories each day, this is less than 60 grams (g) of fat per day.  Fat is an important part of a healthy diet. Eating a low-fat diet can make it hard to maintain a healthy body weight. Ask your dietitian how much fat, calories, and other nutrients you  need each day.  Eat small, frequent meals throughout the day instead of three large meals.  Drink at least 8-10 cups of fluid a day. Drink enough fluid to keep your urine clear or pale yellow.  Limit alcohol intake to no more than 1 drink a day for nonpregnant women and 2 drinks a day for men. One drink equals 12 oz of beer, 5 oz of wine, or 1 oz of hard liquor. Reading food labels  Check Nutrition Facts on food labels for the amount of fat per serving. Choose foods with less than 3 grams of fat per serving.   Shopping  Choose nonfat and low-fat healthy foods. Look for the words "nonfat," "low fat," or "fat free."  Avoid buying processed or prepackaged foods. Cooking  Cook using low-fat methods, such as baking, broiling, grilling, or boiling.  Cook with small amounts of healthy fats, such as olive oil, grapeseed oil, canola oil, or sunflower oil. What foods are recommended?  All fresh, frozen, or canned fruits and vegetables.  Whole grains.  Low-fat or non-fat (skim) milk and yogurt.  Lean meat, skinless poultry, fish, eggs, and beans.  Low-fat protein supplement powders or drinks.  Spices and herbs. What foods are not recommended?  High-fat foods. These include baked goods, fast food, fatty cuts of meat, ice cream, french toast, sweet rolls, pizza, cheese bread, foods covered with butter, creamy sauces, or cheese.  Fried foods. These include french fries, tempura, battered fish, breaded chicken, fried breads, and sweets.  Foods with strong odors.  Foods that cause bloating and gas. Summary  A low-fat diet can be helpful if you have a gallbladder condition, or before and after gallbladder surgery.  Limit your fat intake to less than 30% of your total daily calories. This is about 60 g of fat if you eat 1,800 calories each day.  Eat small, frequent meals throughout the day instead of three large meals. This information is not intended to replace advice given to you by  your health care provider. Make sure you discuss any questions you have with your health care provider. Document Revised: 07/31/2020 Document Reviewed: 07/31/2020 Elsevier Patient Education  2021 [employer].

## 2021-02-24 ENCOUNTER — Ambulatory Visit: Payer: 59

## 2021-02-24 ENCOUNTER — Ambulatory Visit
Admission: RE | Admit: 2021-02-24 | Discharge: 2021-02-24 | Disposition: A | Discharge status: Home / Self Care | Payer: 59 | Source: Ambulatory Visit | Attending: Family Medicine | Admitting: Family Medicine

## 2021-02-24 ENCOUNTER — Other Ambulatory Visit: Payer: Self-pay

## 2021-02-24 ENCOUNTER — Other Ambulatory Visit: Payer: Self-pay | Admitting: Family Medicine

## 2021-02-24 DIAGNOSIS — R200 Anesthesia of skin: Secondary | ICD-10-CM

## 2021-02-24 DIAGNOSIS — R202 Paresthesia of skin: Secondary | ICD-10-CM | POA: Insufficient documentation

## 2021-02-24 DIAGNOSIS — R42 Dizziness and giddiness: Secondary | ICD-10-CM | POA: Diagnosis not present

## 2021-02-24 HISTORY — DX: Malignant (primary) neoplasm, unspecified: C80.1

## 2021-02-24 MED ORDER — IOHEXOL 300 MG/ML  SOLN
75.0000 mL | Freq: Once | INTRAMUSCULAR | Status: AC | PRN
  Administered 2021-02-24: 75 mL via INTRAVENOUS

## 2021-02-27 ENCOUNTER — Ambulatory Visit: Payer: 59

## 2021-03-04 ENCOUNTER — Other Ambulatory Visit: Payer: Self-pay

## 2021-03-04 ENCOUNTER — Ambulatory Visit
Admission: RE | Admit: 2021-03-04 | Discharge: 2021-03-04 | Disposition: A | Discharge status: Home / Self Care | Payer: 59 | Source: Ambulatory Visit | Attending: Internal Medicine | Admitting: Internal Medicine

## 2021-03-04 ENCOUNTER — Encounter: Payer: Self-pay | Admitting: Surgery

## 2021-03-04 DIAGNOSIS — R1032 Left lower quadrant pain: Secondary | ICD-10-CM

## 2021-03-04 MED ORDER — IOHEXOL 300 MG/ML  SOLN
125.0000 mL | Freq: Once | INTRAMUSCULAR | Status: AC | PRN
  Administered 2021-03-04: 125 mL via INTRAVENOUS

## 2021-03-11 ENCOUNTER — Other Ambulatory Visit: Payer: Self-pay | Admitting: Internal Medicine

## 2021-03-11 ENCOUNTER — Other Ambulatory Visit: Payer: Self-pay

## 2021-03-11 ENCOUNTER — Ambulatory Visit (INDEPENDENT_AMBULATORY_CARE_PROVIDER_SITE_OTHER): Payer: 59 | Admitting: Surgery

## 2021-03-11 ENCOUNTER — Encounter: Payer: Self-pay | Admitting: Surgery

## 2021-03-11 VITALS — BP 140/89 | HR 77 | Temp 99.2°F | Ht 64.0 in | Wt 249.2 lb

## 2021-03-11 DIAGNOSIS — Z09 Encounter for follow-up examination after completed treatment for conditions other than malignant neoplasm: Secondary | ICD-10-CM

## 2021-03-11 DIAGNOSIS — K810 Acute cholecystitis: Secondary | ICD-10-CM

## 2021-03-11 DIAGNOSIS — M25512 Pain in left shoulder: Secondary | ICD-10-CM

## 2021-03-11 DIAGNOSIS — G8929 Other chronic pain: Secondary | ICD-10-CM

## 2021-03-11 NOTE — Progress Notes (Signed)
03/11/2021  HPI: Jasmine Franco is a 55 y.o. female s/p robotic assisted cholecystectomy on 02/10/21.  She presents for follow up.  Last office visit, her drain was removed.  Today, she reports she's doing well.  Denies any significant pain.  Denies any nausea or vomiting and is eating well.  Vital signs: BP 140/89   Pulse 77   Temp 99.2 F (37.3 C) (Oral)   Ht 5\' 4"  (1.626 m)   Wt 249 lb 3.2 oz (113 kg)   SpO2 96%   BMI 42.78 kg/m    Physical Exam: Constitutional: No acute distress Abdomen:  Soft, non-distended, non-tender.  Incisions are healing well, clean, dry, intact.  No infection.  Assessment/Plan: This is a 55 y.o. female s/p robotic assisted cholecystectomy  --Patient is doing well now a month after surgery.   --Follow up prn   Melvyn Neth, [place]

## 2021-03-11 NOTE — Patient Instructions (Signed)

## 2021-03-25 ENCOUNTER — Ambulatory Visit: Payer: 59

## 2021-04-29 ENCOUNTER — Other Ambulatory Visit: Payer: Self-pay

## 2021-04-29 ENCOUNTER — Ambulatory Visit (INDEPENDENT_AMBULATORY_CARE_PROVIDER_SITE_OTHER): Payer: 59 | Admitting: Podiatry

## 2021-04-29 ENCOUNTER — Encounter: Payer: Self-pay | Admitting: Podiatry

## 2021-04-29 DIAGNOSIS — M722 Plantar fascial fibromatosis: Secondary | ICD-10-CM

## 2021-04-29 MED ORDER — ETODOLAC ER 400 MG PO TB24
400.0000 mg | ORAL_TABLET | Freq: Every day | ORAL | 3 refills | Status: DC
Start: 2021-04-29 — End: 2021-06-22

## 2021-04-29 NOTE — Progress Notes (Signed)
She presents today for follow-up of her medication she takes etodolac on a daily basis for her Planter fasciitis and her osteoarthritis of first metatarsophalangeal joints.  States that she has been doing pretty well with this and has no problems with that heels and forefoot as long she continues to take the medicine properly.  She is going to see her primary care provider states that her lab values are normal.  Objective: Vital signs are stable she alert oriented x3 no erythema edema cellulitis drainage or odor no reproducible pain on palpation medial calcaneal tubercles bilateral.  Hallux valgus is noted bilaterally.  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  Plan: At this point refilled her etodolac and I will follow-up with her on an as-needed basis.

## 2021-05-13 ENCOUNTER — Other Ambulatory Visit: Payer: Self-pay | Admitting: Internal Medicine

## 2021-05-13 DIAGNOSIS — M75122 Complete rotator cuff tear or rupture of left shoulder, not specified as traumatic: Secondary | ICD-10-CM

## 2021-05-13 DIAGNOSIS — M25512 Pain in left shoulder: Secondary | ICD-10-CM

## 2021-05-13 DIAGNOSIS — G8929 Other chronic pain: Secondary | ICD-10-CM

## 2021-05-18 ENCOUNTER — Other Ambulatory Visit: Payer: Self-pay | Admitting: Internal Medicine

## 2021-05-18 ENCOUNTER — Other Ambulatory Visit (HOSPITAL_COMMUNITY): Payer: Self-pay | Admitting: Internal Medicine

## 2021-05-18 DIAGNOSIS — R1310 Dysphagia, unspecified: Secondary | ICD-10-CM

## 2021-05-21 ENCOUNTER — Other Ambulatory Visit: Payer: Self-pay

## 2021-05-21 DIAGNOSIS — H538 Other visual disturbances: Secondary | ICD-10-CM | POA: Diagnosis present

## 2021-05-21 DIAGNOSIS — N179 Acute kidney failure, unspecified: Secondary | ICD-10-CM | POA: Diagnosis present

## 2021-05-21 DIAGNOSIS — N10 Acute tubulo-interstitial nephritis: Secondary | ICD-10-CM | POA: Diagnosis present

## 2021-05-21 DIAGNOSIS — R7989 Other specified abnormal findings of blood chemistry: Secondary | ICD-10-CM | POA: Diagnosis present

## 2021-05-21 DIAGNOSIS — N12 Tubulo-interstitial nephritis, not specified as acute or chronic: Secondary | ICD-10-CM | POA: Diagnosis not present

## 2021-05-21 DIAGNOSIS — Z6841 Body Mass Index (BMI) 40.0 and over, adult: Secondary | ICD-10-CM

## 2021-05-21 DIAGNOSIS — M79651 Pain in right thigh: Secondary | ICD-10-CM | POA: Diagnosis present

## 2021-05-21 DIAGNOSIS — E876 Hypokalemia: Secondary | ICD-10-CM | POA: Diagnosis present

## 2021-05-21 DIAGNOSIS — M25551 Pain in right hip: Secondary | ICD-10-CM | POA: Diagnosis present

## 2021-05-21 DIAGNOSIS — D6959 Other secondary thrombocytopenia: Secondary | ICD-10-CM | POA: Diagnosis present

## 2021-05-21 DIAGNOSIS — R519 Headache, unspecified: Secondary | ICD-10-CM | POA: Diagnosis present

## 2021-05-21 DIAGNOSIS — N3281 Overactive bladder: Secondary | ICD-10-CM | POA: Diagnosis present

## 2021-05-21 DIAGNOSIS — Z85828 Personal history of other malignant neoplasm of skin: Secondary | ICD-10-CM

## 2021-05-21 DIAGNOSIS — R6520 Severe sepsis without septic shock: Secondary | ICD-10-CM | POA: Diagnosis present

## 2021-05-21 DIAGNOSIS — A4159 Other Gram-negative sepsis: Secondary | ICD-10-CM | POA: Diagnosis not present

## 2021-05-21 DIAGNOSIS — R791 Abnormal coagulation profile: Secondary | ICD-10-CM | POA: Diagnosis present

## 2021-05-21 DIAGNOSIS — E872 Acidosis: Secondary | ICD-10-CM | POA: Diagnosis present

## 2021-05-21 DIAGNOSIS — Z888 Allergy status to other drugs, medicaments and biological substances status: Secondary | ICD-10-CM

## 2021-05-21 DIAGNOSIS — M75102 Unspecified rotator cuff tear or rupture of left shoulder, not specified as traumatic: Secondary | ICD-10-CM | POA: Diagnosis present

## 2021-05-21 DIAGNOSIS — F32A Depression, unspecified: Secondary | ICD-10-CM | POA: Diagnosis present

## 2021-05-21 DIAGNOSIS — Z20822 Contact with and (suspected) exposure to covid-19: Secondary | ICD-10-CM | POA: Diagnosis present

## 2021-05-21 DIAGNOSIS — Z79899 Other long term (current) drug therapy: Secondary | ICD-10-CM

## 2021-05-21 LAB — CBC
HCT: 42.8 % (ref 36.0–46.0)
Hemoglobin: 14.4 g/dL (ref 12.0–15.0)
MCH: 29.3 pg (ref 26.0–34.0)
MCHC: 33.6 g/dL (ref 30.0–36.0)
MCV: 87.2 fL (ref 80.0–100.0)
Platelets: 209 10*3/uL (ref 150–400)
RBC: 4.91 MIL/uL (ref 3.87–5.11)
RDW: 14.5 % (ref 11.5–15.5)
WBC: 13.2 10*3/uL — ABNORMAL HIGH (ref 4.0–10.5)
nRBC: 0 % (ref 0.0–0.2)

## 2021-05-21 LAB — COMPREHENSIVE METABOLIC PANEL
ALT: 17 U/L (ref 0–44)
AST: 23 U/L (ref 15–41)
Albumin: 3.7 g/dL (ref 3.5–5.0)
Alkaline Phosphatase: 109 U/L (ref 38–126)
Anion gap: 14 (ref 5–15)
BUN: 19 mg/dL (ref 6–20)
CO2: 24 mmol/L (ref 22–32)
Calcium: 8.7 mg/dL — ABNORMAL LOW (ref 8.9–10.3)
Chloride: 99 mmol/L (ref 98–111)
Creatinine, Ser: 0.92 mg/dL (ref 0.44–1.00)
GFR, Estimated: >60 mL/min (ref >60)
Glucose, Bld: 134 mg/dL — ABNORMAL HIGH (ref 70–99)
Potassium: 4 mmol/L (ref 3.5–5.1)
Sodium: 137 mmol/L (ref 135–145)
Total Bilirubin: 1 mg/dL (ref 0.3–1.2)
Total Protein: 7.3 g/dL (ref 6.5–8.1)

## 2021-05-21 LAB — URINALYSIS, COMPLETE (UACMP) WITH MICROSCOPIC
Bilirubin Urine: NEGATIVE
Glucose, UA: NEGATIVE mg/dL
Ketones, ur: NEGATIVE mg/dL
Nitrite: NEGATIVE
Protein, ur: 30 mg/dL — AB
RBC / HPF: >50 RBC/hpf — ABNORMAL HIGH (ref 0–5)
Specific Gravity, Urine: 1.011 (ref 1.005–1.030)
WBC, UA: >50 WBC/hpf — ABNORMAL HIGH (ref 0–5)
pH: 8 (ref 5.0–8.0)

## 2021-05-21 LAB — LACTIC ACID, PLASMA: Lactic Acid, Venous: 2 mmol/L (ref 0.5–1.9)

## 2021-05-21 MED ORDER — ACETAMINOPHEN 325 MG PO TABS
ORAL_TABLET | ORAL | Status: AC
  Filled 2021-05-21: qty 2

## 2021-05-21 MED ORDER — ACETAMINOPHEN 325 MG PO TABS
650.0000 mg | ORAL_TABLET | Freq: Once | ORAL | Status: AC
  Administered 2021-05-21: 650 mg via ORAL

## 2021-05-21 NOTE — ED Triage Notes (Signed)
Pt states since this am she has had urinary urgency and frequency but voids small amounts each time. Has also had a lot of lower abd and lower back pain.

## 2021-05-22 ENCOUNTER — Emergency Department: Payer: 59

## 2021-05-22 ENCOUNTER — Ambulatory Visit: Admission: RE | Admit: 2021-05-22 | Payer: 59 | Source: Ambulatory Visit

## 2021-05-22 ENCOUNTER — Encounter: Payer: Self-pay | Admitting: Internal Medicine

## 2021-05-22 ENCOUNTER — Inpatient Hospital Stay
Admission: EM | Admit: 2021-05-22 | Discharge: 2021-05-27 | DRG: 872 | Disposition: A | Discharge status: Home / Self Care | Payer: 59 | Attending: Internal Medicine | Admitting: Internal Medicine

## 2021-05-22 DIAGNOSIS — F32A Depression, unspecified: Secondary | ICD-10-CM

## 2021-05-22 DIAGNOSIS — A4159 Other Gram-negative sepsis: Secondary | ICD-10-CM | POA: Diagnosis present

## 2021-05-22 DIAGNOSIS — N3281 Overactive bladder: Secondary | ICD-10-CM | POA: Diagnosis present

## 2021-05-22 DIAGNOSIS — A419 Sepsis, unspecified organism: Secondary | ICD-10-CM | POA: Diagnosis not present

## 2021-05-22 DIAGNOSIS — M75102 Unspecified rotator cuff tear or rupture of left shoulder, not specified as traumatic: Secondary | ICD-10-CM | POA: Diagnosis present

## 2021-05-22 DIAGNOSIS — R059 Cough, unspecified: Secondary | ICD-10-CM | POA: Diagnosis not present

## 2021-05-22 DIAGNOSIS — N179 Acute kidney failure, unspecified: Secondary | ICD-10-CM | POA: Diagnosis present

## 2021-05-22 DIAGNOSIS — R519 Headache, unspecified: Secondary | ICD-10-CM | POA: Diagnosis present

## 2021-05-22 DIAGNOSIS — M79651 Pain in right thigh: Secondary | ICD-10-CM | POA: Diagnosis present

## 2021-05-22 DIAGNOSIS — R6520 Severe sepsis without septic shock: Secondary | ICD-10-CM | POA: Diagnosis present

## 2021-05-22 DIAGNOSIS — Z85828 Personal history of other malignant neoplasm of skin: Secondary | ICD-10-CM | POA: Diagnosis not present

## 2021-05-22 DIAGNOSIS — H538 Other visual disturbances: Secondary | ICD-10-CM | POA: Diagnosis present

## 2021-05-22 DIAGNOSIS — R791 Abnormal coagulation profile: Secondary | ICD-10-CM | POA: Diagnosis present

## 2021-05-22 DIAGNOSIS — Z888 Allergy status to other drugs, medicaments and biological substances status: Secondary | ICD-10-CM | POA: Diagnosis not present

## 2021-05-22 DIAGNOSIS — N12 Tubulo-interstitial nephritis, not specified as acute or chronic: Secondary | ICD-10-CM

## 2021-05-22 DIAGNOSIS — H539 Unspecified visual disturbance: Secondary | ICD-10-CM | POA: Diagnosis not present

## 2021-05-22 DIAGNOSIS — Z79899 Other long term (current) drug therapy: Secondary | ICD-10-CM | POA: Diagnosis not present

## 2021-05-22 DIAGNOSIS — R7989 Other specified abnormal findings of blood chemistry: Secondary | ICD-10-CM | POA: Diagnosis present

## 2021-05-22 DIAGNOSIS — D6959 Other secondary thrombocytopenia: Secondary | ICD-10-CM | POA: Diagnosis present

## 2021-05-22 DIAGNOSIS — E872 Acidosis, unspecified: Secondary | ICD-10-CM

## 2021-05-22 DIAGNOSIS — E66813 Obesity, class 3: Secondary | ICD-10-CM | POA: Diagnosis present

## 2021-05-22 DIAGNOSIS — Z20822 Contact with and (suspected) exposure to covid-19: Secondary | ICD-10-CM | POA: Diagnosis present

## 2021-05-22 DIAGNOSIS — R52 Pain, unspecified: Secondary | ICD-10-CM

## 2021-05-22 DIAGNOSIS — D696 Thrombocytopenia, unspecified: Secondary | ICD-10-CM | POA: Diagnosis not present

## 2021-05-22 DIAGNOSIS — M25551 Pain in right hip: Secondary | ICD-10-CM | POA: Diagnosis present

## 2021-05-22 DIAGNOSIS — E876 Hypokalemia: Secondary | ICD-10-CM | POA: Diagnosis present

## 2021-05-22 DIAGNOSIS — Z6841 Body Mass Index (BMI) 40.0 and over, adult: Secondary | ICD-10-CM | POA: Diagnosis not present

## 2021-05-22 DIAGNOSIS — N10 Acute tubulo-interstitial nephritis: Secondary | ICD-10-CM | POA: Diagnosis present

## 2021-05-22 LAB — RESP PANEL BY RT-PCR (FLU A&B, COVID) ARPGX2
Influenza A by PCR: NEGATIVE
Influenza B by PCR: NEGATIVE
SARS Coronavirus 2 by RT PCR: NEGATIVE

## 2021-05-22 LAB — GLUCOSE, CAPILLARY: Glucose-Capillary: 126 mg/dL — ABNORMAL HIGH (ref 70–99)

## 2021-05-22 LAB — LACTIC ACID, PLASMA: Lactic Acid, Venous: 1.6 mmol/L (ref 0.5–1.9)

## 2021-05-22 MED ORDER — ACETAMINOPHEN 650 MG RE SUPP: 650.0000 mg | Freq: Four times a day (QID) | RECTAL | Status: DC | PRN

## 2021-05-22 MED ORDER — SODIUM CHLORIDE 0.9 % IV SOLN
1.0000 g | INTRAVENOUS | Status: DC
  Administered 2021-05-23 – 2021-05-25 (×3): 1 g via INTRAVENOUS
  Filled 2021-05-22: qty 1
  Filled 2021-05-22: qty 10
  Filled 2021-05-22 (×2): qty 1

## 2021-05-22 MED ORDER — ENOXAPARIN SODIUM 40 MG/0.4ML IJ SOSY
40.0000 mg | PREFILLED_SYRINGE | INTRAMUSCULAR | Status: DC
  Administered 2021-05-22 – 2021-05-27 (×6): 40 mg via SUBCUTANEOUS
  Filled 2021-05-22 (×6): qty 0.4

## 2021-05-22 MED ORDER — KETOROLAC TROMETHAMINE 30 MG/ML IJ SOLN
15.0000 mg | Freq: Once | INTRAMUSCULAR | Status: AC
  Administered 2021-05-22: 15 mg via INTRAVENOUS
  Filled 2021-05-22: qty 1

## 2021-05-22 MED ORDER — CYCLOBENZAPRINE HCL 10 MG PO TABS: 10.0000 mg | ORAL_TABLET | Freq: Every evening | ORAL | Status: DC | PRN

## 2021-05-22 MED ORDER — ONDANSETRON HCL 4 MG/2ML IJ SOLN
4.0000 mg | Freq: Once | INTRAMUSCULAR | Status: AC
  Administered 2021-05-22: 4 mg via INTRAVENOUS
  Filled 2021-05-22: qty 2

## 2021-05-22 MED ORDER — OXYCODONE HCL 5 MG PO TABS
5.0000 mg | ORAL_TABLET | Freq: Once | ORAL | Status: AC
  Administered 2021-05-22: 5 mg via ORAL
  Filled 2021-05-22: qty 1

## 2021-05-22 MED ORDER — ONDANSETRON HCL 4 MG PO TABS: 4.0000 mg | ORAL_TABLET | Freq: Four times a day (QID) | ORAL | Status: DC | PRN

## 2021-05-22 MED ORDER — SODIUM CHLORIDE 0.9 % IV SOLN
1.0000 g | Freq: Once | INTRAVENOUS | Status: AC
  Administered 2021-05-22: 1 g via INTRAVENOUS
  Filled 2021-05-22: qty 10

## 2021-05-22 MED ORDER — HYDROMORPHONE HCL 1 MG/ML IJ SOLN
0.5000 mg | Freq: Once | INTRAMUSCULAR | Status: AC
  Administered 2021-05-22: 0.5 mg via INTRAVENOUS
  Filled 2021-05-22: qty 1

## 2021-05-22 MED ORDER — SODIUM CHLORIDE 0.9 % IV SOLN
12.5000 mg | Freq: Once | INTRAVENOUS | Status: AC
  Administered 2021-05-22: 12.5 mg via INTRAVENOUS
  Filled 2021-05-22: qty 0.5

## 2021-05-22 MED ORDER — SODIUM CHLORIDE 0.9 % IV BOLUS
1000.0000 mL | Freq: Once | INTRAVENOUS | Status: AC
  Administered 2021-05-22: 1000 mL via INTRAVENOUS

## 2021-05-22 MED ORDER — ONDANSETRON HCL 4 MG/2ML IJ SOLN
4.0000 mg | Freq: Four times a day (QID) | INTRAMUSCULAR | Status: DC | PRN
  Administered 2021-05-22 – 2021-05-24 (×2): 4 mg via INTRAVENOUS
  Filled 2021-05-22 (×2): qty 2

## 2021-05-22 MED ORDER — MORPHINE SULFATE (PF) 2 MG/ML IV SOLN
2.0000 mg | INTRAVENOUS | Status: DC | PRN
  Administered 2021-05-22 – 2021-05-24 (×3): 2 mg via INTRAVENOUS
  Filled 2021-05-22 (×3): qty 1

## 2021-05-22 MED ORDER — LACTATED RINGERS IV SOLN: INTRAVENOUS | Status: DC

## 2021-05-22 MED ORDER — OXYBUTYNIN CHLORIDE ER 10 MG PO TB24
10.0000 mg | ORAL_TABLET | Freq: Every day | ORAL | Status: DC
  Administered 2021-05-23 – 2021-05-26 (×4): 10 mg via ORAL
  Filled 2021-05-22 (×6): qty 1

## 2021-05-22 MED ORDER — KETOROLAC TROMETHAMINE 30 MG/ML IJ SOLN
15.0000 mg | Freq: Four times a day (QID) | INTRAMUSCULAR | Status: DC | PRN
  Administered 2021-05-22: 15 mg via INTRAVENOUS
  Filled 2021-05-22 (×2): qty 1

## 2021-05-22 MED ORDER — AMITRIPTYLINE HCL 25 MG PO TABS
25.0000 mg | ORAL_TABLET | Freq: Every day | ORAL | Status: DC
  Administered 2021-05-22 – 2021-05-26 (×5): 25 mg via ORAL
  Filled 2021-05-22 (×5): qty 1

## 2021-05-22 MED ORDER — ACETAMINOPHEN 325 MG PO TABS
650.0000 mg | ORAL_TABLET | Freq: Four times a day (QID) | ORAL | Status: DC | PRN
  Administered 2021-05-22 – 2021-05-26 (×5): 650 mg via ORAL
  Filled 2021-05-22 (×5): qty 2

## 2021-05-22 NOTE — ED Notes (Signed)
Patient transported to CT 

## 2021-05-22 NOTE — H&P (Signed)
History and Physical    Jasmine Franco FVC:[id number] DOB: 07-19-1966 DOA: 05/22/2021  PCP: Gladstone Lighter, MD   Patient coming from: Home  I have personally briefly reviewed patient's old medical records in [place]  Chief Complaint: Urgency/frequency  HPI: Jasmine Franco is a 55 y.o. female with medical history significant for morbid obesity, overactive bladder, left shoulder pain from rotator cuff tear recently started on steroids for pain control presents to the ER for evaluation of urgency and frequency over the last 24 hours.  She states that she feels the urge to void and when she goes to the bathroom she only dribbles a couple of drops of urine and has to go back to the bathroom in a few minutes.  This continued throughout the course of the day and then towards the evening she developed pain in her back radiates into her groin.  He does not have a known history of nephrolithiasis.  States that her urine has been foul-smelling.  She has also had nausea, headache, fever and chills. She denies having any cough, no chest pain, no shortness of breath, no palpitations, no diaphoresis, no emesis, no changes in her bowel habits, no blurred vision, no focal deficits, no dizziness or lightheadedness. Labs show sodium 137, potassium 4.0, chloride 99, bicarb 24, glucose 134, BUN 19, creatinine 0.92, calcium 8.7, alkaline phosphatase 109, albumin 3.7, AST 23, ALT 17, total protein 7.3, lactic acid 2.0 >> 1.6, white count 13.2, hemoglobin 14.4, hematocrit 42.8, MCV 87.2, RDW 14.5, platelet count 209 Respiratory viral panel is negative Patient has pyuria Renal stone CT shows interval development of marked asymmetric right perinephric fat stranding. Recently identified punctate right nephrolithiasis no longer visualized. Findings may be due to recently passed stone. Correlate with urinalysis for infection. Diffuse sigmoid diverticulosis with no acute diverticulitis. Aortic Atherosclerosis     ED Course: Patient is a 56 year old Caucasian female presents emergency room for evaluation of bilateral flank pain mostly on the right side associated with urgency of urination and frequency. She also had chills and fever with a T-max of 99.7.  Labs show leukocytosis with pyuria as well as lactic acidosis. She will be admitted to the hospital for further evaluation.    Review of Systems: As per HPI otherwise all other systems reviewed and negative.    Past Medical History:  Diagnosis Date  . Cancer [facility])     Past Surgical History:  Procedure Laterality Date  . CHOLECYSTECTOMY       reports that she has never smoked. She has never used smokeless tobacco. She reports that she does not drink alcohol and does not use drugs.  Allergies  Allergen Reactions  . Cortisone Other (See Comments)    redness    Family History  Problem Relation Age of Onset  . Diabetes Mother       Prior to Admission medications   Medication Sig Start Date End Date Taking? Authorizing Provider  amitriptyline (ELAVIL) 25 MG tablet Take 25 mg by mouth at bedtime. 03/11/21 03/11/22 Yes [provider]  cyclobenzaprine (FLEXERIL) 10 MG tablet Take 10 mg by mouth at bedtime as needed for muscle spasms. 05/13/21 07/12/21 Yes [provider]  desonide (DESOWEN) 0.05 % cream Apply topically 2 (two) times daily. 12/08/20  Yes [provider]  etodolac (LODINE XL) 400 MG 24 hr tablet Take 1 tablet (400 mg total) by mouth daily. 04/29/21  Yes Hyatt, Max T, DPM  oxybutynin (DITROPAN-XL) 10 MG 24 hr tablet Take  10 mg by mouth daily. 05/13/21 05/13/22 Yes [provider]  gabapentin (NEURONTIN) 100 MG capsule Take by mouth. Patient not taking: Reported on 05/22/2021 04/08/21   [provider]    Physical Exam: Vitals:   05/22/21 0530 05/22/21 0600 05/22/21 0615 05/22/21 0630  BP: 104/61 (!) 113/57  (!) 101/45  Pulse: (!) 112 (!) 115 (!) 108 (!) 109  Resp:  18  18  Temp:       TempSrc:      SpO2: 95% 100% 100% 94%  Weight:      Height:         Vitals:   05/22/21 0530 05/22/21 0600 05/22/21 0615 05/22/21 0630  BP: 104/61 (!) 113/57  (!) 101/45  Pulse: (!) 112 (!) 115 (!) 108 (!) 109  Resp:  18  18  Temp:      TempSrc:      SpO2: 95% 100% 100% 94%  Weight:      Height:          Constitutional: Alert and oriented x 3 . Not in any apparent distress HEENT:      Head: Normocephalic and atraumatic.         Eyes: PERLA, EOMI, Conjunctivae are normal. Sclera is non-icteric.       Mouth/Throat: Mucous membranes are moist.       Neck: Supple with no signs of meningismus. Cardiovascular: Tachycardia. No murmurs, gallops, or rubs. 2+ symmetrical distal pulses are present . No JVD. No LE edema Respiratory: Respiratory effort normal .Lungs sounds clear bilaterally. No wheezes, crackles, or rhonchi.  Gastrointestinal: Soft, non tender, and non distended with positive bowel sounds.  Genitourinary:  Right CVA tenderness. Musculoskeletal: Nontender with normal range of motion in all extremities. No cyanosis, or erythema of extremities. Neurologic:  Face is symmetric. Moving all extremities. No gross focal neurologic deficits  Skin: Skin is warm, dry.  No rash or ulcers Psychiatric: Mood and affect are normal   Labs on Admission: I have personally reviewed following labs and imaging studies  CBC: Recent Labs  Lab 05/21/21 2144  WBC 13.2*  HGB 14.4  HCT 42.8  MCV 87.2  PLT 322   Basic Metabolic Panel: Recent Labs  Lab 05/21/21 2144  NA 137  K 4.0  CL 99  CO2 24  GLUCOSE 134*  BUN 19  CREATININE 0.92  CALCIUM 8.7*   GFR: Estimated Creatinine Clearance: 85.3 mL/min (by C-G formula based on SCr of 0.92 mg/dL). Liver Function Tests: Recent Labs  Lab 05/21/21 2144  AST 23  ALT 17  ALKPHOS 109  BILITOT 1.0  PROT 7.3  ALBUMIN 3.7   No results for input(s): LIPASE, AMYLASE in the last 168 hours. No results for input(s): AMMONIA in the  last 168 hours. Coagulation Profile: No results for input(s): INR, PROTIME in the last 168 hours. Cardiac Enzymes: No results for input(s): CKTOTAL, CKMB, CKMBINDEX, TROPONINI in the last 168 hours. BNP (last 3 results) No results for input(s): PROBNP in the last 8760 hours. HbA1C: No results for input(s): HGBA1C in the last 72 hours. CBG: No results for input(s): GLUCAP in the last 168 hours. Lipid Profile: No results for input(s): CHOL, HDL, LDLCALC, TRIG, CHOLHDL, LDLDIRECT in the last 72 hours. Thyroid Function Tests: No results for input(s): TSH, T4TOTAL, FREET4, T3FREE, THYROIDAB in the last 72 hours. Anemia Panel: No results for input(s): VITAMINB12, FOLATE, FERRITIN, TIBC, IRON, RETICCTPCT in the last 72 hours. Urine analysis:    Component Value Date/Time  COLORURINE YELLOW (A) 05/21/2021 2144   APPEARANCEUR HAZY (A) 05/21/2021 2144   LABSPEC 1.011 05/21/2021 2144   PHURINE 8.0 05/21/2021 2144   GLUCOSEU NEGATIVE 05/21/2021 2144   HGBUR LARGE (A) 05/21/2021 2144   [place] NEGATIVE 05/21/2021 2144   [place] NEGATIVE 05/21/2021 2144   PROTEINUR 30 (A) 05/21/2021 2144   NITRITE NEGATIVE 05/21/2021 2144   LEUKOCYTESUR LARGE (A) 05/21/2021 2144    Radiological Exams on Admission: CT Renal Stone Study  Result Date: 05/22/2021 CLINICAL DATA:  Bilateral flank pain. EXAM: CT ABDOMEN AND PELVIS WITHOUT CONTRAST TECHNIQUE: Multidetector CT imaging of the abdomen and pelvis was performed following the standard protocol without IV contrast. COMPARISON:  CT abdomen pelvis 03/04/2021 FINDINGS: Lower chest: No acute abnormality. Hepatobiliary: No focal liver abnormality. Status post cholecystectomy. No biliary dilatation. Pancreas: No focal lesion. Normal pancreatic contour. No surrounding inflammatory changes. No main pancreatic ductal dilatation. Spleen: Normal in size without focal abnormality. Adrenals/Urinary Tract: No adrenal nodule bilaterally. Interval development of marked  asymmetric right perinephric fat stranding. Redemonstration of a 5.5 x 5.2 cm fluid density lesion within the right kidney that likely represents a simple renal cyst. No nephrolithiasis, no hydronephrosis, and no contour-deforming renal mass. No ureterolithiasis or hydroureter. The urinary bladder is unremarkable. Stomach/Bowel: Stomach is within normal limits. No evidence of bowel wall thickening or dilatation. Diffuse sigmoid diverticulosis. The appendix is normal in caliber. Appendicoliths noted within the appendix. Vascular/Lymphatic: No abdominal aorta or iliac aneurysm. Mild atherosclerotic plaque of the aorta and its branches. No abdominal, pelvic, or inguinal lymphadenopathy. Reproductive: Uterus and bilateral adnexa are unremarkable. Other: No intraperitoneal free fluid. No intraperitoneal free gas. No organized fluid collection. Musculoskeletal: No abdominal aorta or iliac aneurysm. Mild atherosclerotic plaque of the aorta and its branches. No abdominal, pelvic, or inguinal lymphadenopathy. IMPRESSION: 1. Interval development of marked asymmetric right perinephric fat stranding. Recently identified punctate right nephrolithiasis no longer visualized. Findings may be due to recently passed stone. Correlate with urinalysis for infection. 2. Diffuse sigmoid diverticulosis with no acute diverticulitis. 3.  Aortic Atherosclerosis (ICD10-I70.0). Electronically Signed   By: Iven Finn M.D.   On: 05/22/2021 02:26     Assessment/Plan Principal Problem:   Sepsis ([place]) Active Problems:   OAB (overactive bladder)   Acute pyelonephritis   Obesity, Class III, BMI 40-49.9 (morbid obesity) ([place])     Sepsis from acute pyelonephritis As evidenced by pyuria, tachycardia, leukocytosis and lactic acid of 2. Patient presents for evaluation of bilateral flank pain right greater than left associated with urinary frequency and urgency and has significant pyuria Imaging shows right perinephric fat stranding  and findings suggestive of a recently passed stone. We will treat patient empirically with Rocephin 1 g IV daily until urine culture results become available    Overactive bladder Continue oxybutynin    Morbid obesity (BMI 29.7 kg/m2) Complicates overall prognosis and care  DVT prophylaxis: Lovenox Code Status: full code Family Communication: Greater than 50% of time was spent discussing patient's condition and plan of care with her at the bedside.  All questions and concerns have been addressed.  She verbalizes understanding and Disposition Plan: Back to previous home environment Consults called: none Status: At the time of admission, it appears that the appropriate admission status for this patient is inpatient. This is judged to be reasonable and necessary in order to provide the required intensity of service to ensure the patient's safety given the presenting symptoms, physical exam findings, and initial radiographic and laboratory data in the context  of the comorbid conditions. Patient requires inpatient status due to high intensity of service, high risk for further deterioration and high frequency of surveillance required.    Collier Bullock MD [facility]     05/22/2021, 8:10 AM

## 2021-05-22 NOTE — ED Notes (Signed)
Georgie RN aware of assigned bed 

## 2021-05-22 NOTE — ED Notes (Signed)
Post void bladder scan performed. Results 0 mL

## 2021-05-22 NOTE — ED Notes (Signed)
Will hold on I&O cath as pt just urinated large amount. Denies any relief of pain. Still requesting different pain med. No new orders.

## 2021-05-22 NOTE — ED Notes (Signed)
Handoff to [employer].

## 2021-05-22 NOTE — ED Notes (Signed)
Pt transport here to take patient up to new room.

## 2021-05-22 NOTE — ED Notes (Signed)
Pt currently vomiting. Provider [place] notified of request for another nausea med.

## 2021-05-22 NOTE — ED Notes (Signed)
Called lab to have them draw blood culture

## 2021-05-22 NOTE — ED Notes (Signed)
Pt's daughter at bedside requesting that a doctor come in and review results and plan with her and pt. Provider [place] notified via secure chat.

## 2021-05-22 NOTE — ED Notes (Signed)
Provider Agbata notified pain unchanged and pt requesting to try something else. Pt may need I&O cath.

## 2021-05-22 NOTE — ED Provider Notes (Signed)
[facility] Emergency Department Provider Note  ____________________________________________   Event Date/Time   First MD Initiated Contact with Patient 05/22/21 0031     (approximate)  I have reviewed the triage vital signs and the nursing notes.   HISTORY  Chief Complaint Dysuria    HPI Jasmine Franco is a 55 y.o. female with skin cancer, gallbladder removal who comes in with difficulties with urinating.  Patient reports that her symptoms started today.  She states that it feels hard to get all of her urine out, intermittent, nothing makes it better, nothing makes it worse.  Does report a foul smell to her urine.  She states that she was recently started on a Solu-Medrol Dosepak it for some arm pain by her primary doctor.  She states that she noticed a foul smell few days ago.  She also reports some bilateral back pain radiating into her groin.  Denies a history of kidney stones.  Patient does report some chills as well.          Past Medical History:  Diagnosis Date  . Cancer [facility])     Patient Active Problem List   Diagnosis Date Noted  . Acute cholecystitis 02/11/2021  . Symptomatic cholelithiasis 02/10/2021  . Cholecystitis   . Bilateral low back pain without sciatica 12/08/2020  . OAB (overactive bladder) 12/08/2020    Past Surgical History:  Procedure Laterality Date  . CHOLECYSTECTOMY      Prior to Admission medications   Medication Sig Start Date End Date Taking? Authorizing Provider  amitriptyline (ELAVIL) 25 MG tablet Take by mouth. 03/11/21 03/11/22  [provider]  desonide (DESOWEN) 0.05 % cream Apply topically 2 (two) times daily. 12/08/20   [provider]  etodolac (LODINE XL) 400 MG 24 hr tablet Take 1 tablet (400 mg total) by mouth daily. 04/29/21   Hyatt, Max T, DPM  gabapentin (NEURONTIN) 100 MG capsule Take by mouth. 04/08/21   [provider]  oxybutynin (DITROPAN-XL) 5 MG 24 hr tablet Take 5  mg by mouth daily. 01/30/21   [provider]    Allergies Cortisone  Family History  Problem Relation Age of Onset  . Diabetes Mother     Social History Social History   Tobacco Use  . Smoking status: Never Smoker  . Smokeless tobacco: Never Used  Substance Use Topics  . Alcohol use: No  . Drug use: No      Review of Systems Constitutional: Positive chills Eyes: No visual changes. ENT: No sore throat. Cardiovascular: Denies chest pain. Respiratory: Denies shortness of breath. Gastrointestinal: No abdominal pain.  No nausea, no vomiting.  No diarrhea.  No constipation. Genitourinary: Unable to get urine out, foul smell Musculoskeletal: Positive back pain Skin: Negative for rash. Neurological: Negative for headaches, focal weakness or numbness. All other ROS negative ____________________________________________   PHYSICAL EXAM:  VITAL SIGNS: ED Triage Vitals  Enc Vitals Group     BP 05/21/21 2138 (!) 149/97     Pulse Rate 05/21/21 2138 (!) 128     Resp 05/21/21 2138 20     Temp 05/21/21 2138 99.7 F (37.6 C)     Temp Source 05/21/21 2138 Oral     SpO2 05/21/21 2138 100 %     Weight 05/21/21 2139 250 lb (113.4 kg)     Height 05/21/21 2139 5\' 4"  (1.626 m)     Head Circumference --      Peak Flow --  Pain Score 05/21/21 2139 10     Pain Loc --      Pain Edu? --      Excl. in [place]? --     Constitutional: Alert and oriented. Well appearing and in no acute distress. Eyes: Conjunctivae are normal. EOMI. Head: Atraumatic. Nose: No congestion/rhinnorhea. Mouth/Throat: Mucous membranes are moist.   Neck: No stridor. Trachea Midline. FROM Cardiovascular: Tachycardic, regular rhythm. Grossly normal heart sounds.  Good peripheral circulation. Respiratory: Normal respiratory effort.  No retractions. Lungs CTAB. Gastrointestinal: Soft and nontender. No distention. No abdominal bruits.  Musculoskeletal: No lower extremity tenderness nor edema.  No joint  effusions. Neurologic:  Normal speech and language. No gross focal neurologic deficits are appreciated.  Skin:  Skin is warm, dry and intact. No rash noted. Psychiatric: Mood and affect are normal. Speech and behavior are normal. GU: Deferred   ____________________________________________   LABS (all labs ordered are listed, but only abnormal results are displayed)  Labs Reviewed  CBC - Abnormal; Notable for the following components:      Result Value   WBC 13.2 (*)    All other components within normal limits  COMPREHENSIVE METABOLIC PANEL - Abnormal; Notable for the following components:   Glucose, Bld 134 (*)    Calcium 8.7 (*)    All other components within normal limits  LACTIC ACID, PLASMA - Abnormal; Notable for the following components:   Lactic Acid, Venous 2.0 (*)    All other components within normal limits  URINALYSIS, COMPLETE (UACMP) WITH MICROSCOPIC - Abnormal; Notable for the following components:   Color, Urine YELLOW (*)    APPearance HAZY (*)    Hgb urine dipstick LARGE (*)    Protein, ur 30 (*)    Leukocytes,Ua LARGE (*)    RBC / HPF >50 (*)    WBC, UA >50 (*)    Bacteria, UA MANY (*)    All other components within normal limits  LACTIC ACID, PLASMA   ____________________________________________    RADIOLOGY Robert Bellow, personally viewed and evaluated these images (plain radiographs) as part of my medical decision making, as well as reviewing the written report by the radiologist.  ED MD interpretation: Stranding of the right kidney  Official radiology report(s): No results found. IMPRESSION: 1. Interval development of marked asymmetric right perinephric fat stranding. Recently identified punctate right nephrolithiasis no longer visualized. Findings may be due to recently passed stone. Correlate with urinalysis for infection. 2. Diffuse sigmoid diverticulosis with no acute diverticulitis. 3. Aortic Atherosclerosis  (ICD10-I70.0). ____________________________________________   PROCEDURES  Procedure(s) performed (including Critical Care):  Procedures   ____________________________________________   INITIAL IMPRESSION / ASSESSMENT AND PLAN / ED COURSE  Jasmine Franco was evaluated in Emergency Department on 05/22/2021 for the symptoms described in the history of present illness. She was evaluated in the context of the global COVID-19 pandemic, which necessitated consideration that the patient might be at risk for infection with the SARS-CoV-2 virus that causes COVID-19. Institutional protocols and algorithms that pertain to the evaluation of patients at risk for COVID-19 are in a state of rapid change based on information released by regulatory bodies including the CDC and federal and state organizations. These policies and algorithms were followed during the patient's care in the ED.    Patient is a 55 year old who comes in tachycardic with some low-grade temperatures with concerns of not being able to get her urine out increasing abdominal, back pain.  Urinary shows concerns for UTI.  No  evidence of AKI.  Will get CT scan to evaluate for kidney stone, [place] nephritis, obstruction or other acute pathology.  We will send for urine culture.  We will give patient some fluids and a dose of ceftriaxone while awaiting results.  We will also get COVID test.  Patient denies any coughing to suggest pneumonia.  UA consistent with UTI.  CT scan was concerning for possible passed stone but correlate for UTI.  Patient does have what looks like a UTI and a concern this could be pyelonephritis.  Reevaluation patient continues to have significant pain and migraine although she is afebrile.  Patient felt more comfortable coming to the hospital for IV antibiotics  Blood cultures and lactate were ordered.  Lactate is downtrending.  On reassessment does not need pressors.        ____________________________________________   FINAL CLINICAL IMPRESSION(S) / ED DIAGNOSES   Final diagnoses:  Pyelonephritis  Sepsis, due to unspecified organism, unspecified whether acute organ dysfunction present [facility])      MEDICATIONS GIVEN DURING THIS VISIT:  Medications  acetaminophen (TYLENOL) 325 MG tablet (has no administration in time range)  acetaminophen (TYLENOL) tablet 650 mg (650 mg Oral Given 05/21/21 2147)  sodium chloride 0.9 % bolus 1,000 mL (0 mLs Intravenous Stopped 05/22/21 0322)  HYDROmorphone (DILAUDID) injection 0.5 mg (0.5 mg Intravenous Given 05/22/21 0133)  ondansetron (ZOFRAN) injection 4 mg (4 mg Intravenous Given 05/22/21 0133)  cefTRIAXone (ROCEPHIN) 1 g in sodium chloride 0.9 % 100 mL IVPB (0 g Intravenous Stopped 05/22/21 0252)  ketorolac (TORADOL) 30 MG/ML injection 15 mg (15 mg Intravenous Given 05/22/21 0346)  oxyCODONE (Oxy IR/ROXICODONE) immediate release tablet 5 mg (5 mg Oral Given 05/22/21 0541)  ondansetron (ZOFRAN) injection 4 mg (4 mg Intravenous Given 05/22/21 0539)     ED Discharge Orders    None       Note:  This document was prepared using Dragon voice recognition software and may include unintentional dictation errors.   Vanessa Jericho, MD 05/22/21 0600

## 2021-05-23 DIAGNOSIS — A4159 Other Gram-negative sepsis: Secondary | ICD-10-CM | POA: Diagnosis not present

## 2021-05-23 DIAGNOSIS — D696 Thrombocytopenia, unspecified: Secondary | ICD-10-CM

## 2021-05-23 DIAGNOSIS — F32A Depression, unspecified: Secondary | ICD-10-CM

## 2021-05-23 DIAGNOSIS — E872 Acidosis, unspecified: Secondary | ICD-10-CM

## 2021-05-23 DIAGNOSIS — N10 Acute tubulo-interstitial nephritis: Secondary | ICD-10-CM | POA: Diagnosis not present

## 2021-05-23 LAB — BASIC METABOLIC PANEL
Anion gap: 10 (ref 5–15)
BUN: 19 mg/dL (ref 6–20)
CO2: 24 mmol/L (ref 22–32)
Calcium: 8.1 mg/dL — ABNORMAL LOW (ref 8.9–10.3)
Chloride: 101 mmol/L (ref 98–111)
Creatinine, Ser: 1.07 mg/dL — ABNORMAL HIGH (ref 0.44–1.00)
GFR, Estimated: >60 mL/min (ref >60)
Glucose, Bld: 108 mg/dL — ABNORMAL HIGH (ref 70–99)
Potassium: 4 mmol/L (ref 3.5–5.1)
Sodium: 135 mmol/L (ref 135–145)

## 2021-05-23 LAB — CBC
HCT: 36.9 % (ref 36.0–46.0)
Hemoglobin: 12.3 g/dL (ref 12.0–15.0)
MCH: 29.5 pg (ref 26.0–34.0)
MCHC: 33.3 g/dL (ref 30.0–36.0)
MCV: 88.5 fL (ref 80.0–100.0)
Platelets: 137 10*3/uL — ABNORMAL LOW (ref 150–400)
RBC: 4.17 MIL/uL (ref 3.87–5.11)
RDW: 14.7 % (ref 11.5–15.5)
WBC: 14 10*3/uL — ABNORMAL HIGH (ref 4.0–10.5)
nRBC: 0 % (ref 0.0–0.2)

## 2021-05-23 LAB — PROCALCITONIN: Procalcitonin: 3.26 ng/mL

## 2021-05-23 LAB — PROTIME-INR
INR: 1.6 — ABNORMAL HIGH (ref 0.8–1.2)
Prothrombin Time: 19.3 seconds — ABNORMAL HIGH (ref 11.4–15.2)

## 2021-05-23 LAB — CORTISOL-AM, BLOOD: Cortisol - AM: 27.9 ug/dL — ABNORMAL HIGH (ref 6.7–22.6)

## 2021-05-23 MED ORDER — MAGNESIUM SULFATE 2 GM/50ML IV SOLN
2.0000 g | Freq: Once | INTRAVENOUS | Status: AC
  Administered 2021-05-23: 2 g via INTRAVENOUS
  Filled 2021-05-23: qty 50

## 2021-05-23 MED ORDER — KETOROLAC TROMETHAMINE 15 MG/ML IJ SOLN
15.0000 mg | Freq: Once | INTRAMUSCULAR | Status: AC
  Administered 2021-05-23: 15 mg via INTRAVENOUS
  Filled 2021-05-23: qty 1

## 2021-05-23 NOTE — Progress Notes (Signed)
   05/22/21 1905  Assess: MEWS Score  Temp (!) 102.6 F (39.2 C)  BP 133/68  Pulse Rate (!) 110  Resp 17  SpO2 100 %  O2 Device Room Air  Assess: MEWS Score  MEWS Temp 2  MEWS Systolic 0  MEWS Pulse 1  MEWS RR 0  MEWS LOC 0  MEWS Score 3  MEWS Score Color Yellow  Assess: if the MEWS score is Yellow or Red  Were vital signs taken at a resting state? Yes  Focused Assessment No change from prior assessment  Early Detection of Sepsis Score *See Row Information* Medium  MEWS guidelines implemented *See Row Information* Yes  Treat  MEWS Interventions Administered scheduled meds/treatments  Pain Scale 0-10  Pain Score 0  Take Vital Signs  Increase Vital Sign Frequency  Yellow: Q 2hr X 2 then Q 4hr X 2, if remains yellow, continue Q 4hrs  Escalate  MEWS: Escalate Yellow: discuss with charge nurse/RN and consider discussing with provider and RRT  Notify: Charge Nurse/RN  Name of Charge Nurse/RN Notified Cherlynn Perches  Date Charge Nurse/RN Notified 05/22/21  Time Charge Nurse/RN Notified 1920

## 2021-05-23 NOTE — Progress Notes (Signed)
   05/23/21 1726  Assess: MEWS Score  Temp (!) 101.1 F (38.4 C)  BP (!) 147/100  Pulse Rate (!) 108  Resp 18  Level of Consciousness Alert  SpO2 100 %  O2 Device Room Air  Assess: MEWS Score  MEWS Temp 1  MEWS Systolic 0  MEWS Pulse 1  MEWS RR 0  MEWS LOC 0  MEWS Score 2  MEWS Score Color Yellow  Assess: if the MEWS score is Yellow or Red  Were vital signs taken at a resting state? No (HR increased due to Orthostatic VS being taken at the time)  Focused Assessment Change from prior assessment (see assessment flowsheet) (Temp 101.1)  Early Detection of Sepsis Score *See Row Information* High  MEWS guidelines implemented *See Row Information* No, previously yellow, continue vital signs every 4 hours  Treat  MEWS Interventions Administered prn meds/treatments (tylenol and morphine)  Pain Scale 0-10  Pain Score 7  Pain Type Acute pain  Pain Location Flank  Pain Orientation Right  Pain Descriptors / Indicators Sharp  Pain Frequency Constant  Pain Onset With Activity  Pain Intervention(s) Medication (See eMAR)  Take Vital Signs  Increase Vital Sign Frequency   (already on yellow MEWS Q4hrs)  Escalate  MEWS: Escalate Yellow: discuss with charge nurse/RN and consider discussing with provider and RRT  Notify: Charge Nurse/RN  Name of Charge Nurse/RN Notified Claiborne Newcom Isenhour RN  Date Charge Nurse/RN Notified 05/23/21  Time Charge Nurse/RN Notified [place]  Document  Patient Outcome Stabilized after interventions (Temperature WNL after tylenol)  Progress note created (see row info) Yes

## 2021-05-23 NOTE — Progress Notes (Signed)
Patient ID: Jasmine Franco, female   DOB: Feb 09, 1966, 55 y.o.   MRN: [id number] [facility] PROGRESS NOTE  Jasmine Franco [id number]:[id number] DOB: [birth year] DOA: 05/22/2021 PCP: Gladstone Lighter, MD  HPI/Subjective: Patient felt a little lightheaded and dizzy with standing up.  Had a fever this morning.  Had some abdominal pain and burning with urination.  Admitted with sepsis.  Objective: Vitals:   05/23/21 0853 05/23/21 1121  BP: 140/68 120/68  Pulse: (!) 107 99  Resp: 16 18  Temp: 99.9 F (37.7 C) 98.3 F (36.8 C)  SpO2: 96% 95%    Intake/Output Summary (Last 24 hours) at 05/23/2021 1508 Last data filed at 05/23/2021 1248 Gross per 24 hour  Intake 2145.23 ml  Output --  Net 2145.23 ml   Filed Weights   05/21/21 2139  Weight: 113.4 kg    ROS: Review of Systems  Respiratory: Negative for shortness of breath.   Cardiovascular: Negative for chest pain.  Gastrointestinal: Positive for abdominal pain. Negative for diarrhea, nausea and vomiting.  Genitourinary: Positive for dysuria.   Exam: Physical Exam HENT:     Head: Normocephalic.     Mouth/Throat:     Pharynx: No oropharyngeal exudate.  Eyes:     General: Lids are normal.     Conjunctiva/sclera: Conjunctivae normal.     Pupils: Pupils are equal, round, and reactive to light.  Cardiovascular:     Rate and Rhythm: Normal rate and regular rhythm.     Heart sounds: Normal heart sounds, S1 normal and S2 normal.  Pulmonary:     Breath sounds: Normal breath sounds. No decreased breath sounds, wheezing, rhonchi or rales.  Abdominal:     Palpations: Abdomen is soft.     Tenderness: There is abdominal tenderness in the right lower quadrant.  Musculoskeletal:     Right ankle: No swelling.     Left ankle: No swelling.  Skin:    General: Skin is warm.     Findings: No rash.  Neurological:     Mental Status: She is alert and oriented to person, place, and time.       Data Reviewed: Basic Metabolic  Panel: Recent Labs  Lab 05/21/21 2144 05/23/21 0651  NA 137 135  K 4.0 4.0  CL 99 101  CO2 24 24  GLUCOSE 134* 108*  BUN 19 19  CREATININE 0.92 1.07*  CALCIUM 8.7* 8.1*   Liver Function Tests: Recent Labs  Lab 05/21/21 2144  AST 23  ALT 17  ALKPHOS 109  BILITOT 1.0  PROT 7.3  ALBUMIN 3.7   CBC: Recent Labs  Lab 05/21/21 2144 05/23/21 0651  WBC 13.2* 14.0*  HGB 14.4 12.3  HCT 42.8 36.9  MCV 87.2 88.5  PLT 209 137*    CBG: Recent Labs  Lab 05/22/21 2148  GLUCAP 126*    Recent Results (from the past 240 hour(s))  Urine culture     Status: Abnormal (Preliminary result)   Collection Time: 05/21/21  9:44 PM   Specimen: Urine, Random  Result Value Ref Range Status   Specimen Description   Final    URINE, RANDOM Performed at [facility], [address] [place], [place] [address]    Special Requests   Final    NONE Performed at [facility], [address], [place], [place] [address]    Culture (A)  Final    >=100,000 COLONIES/mL KLEBSIELLA PNEUMONIAE SUSCEPTIBILITIES TO FOLLOW Performed at [facility], [place] [address],  [place], [place] [address]    Report Status PENDING  Incomplete  Resp Panel by RT-PCR (Flu A&B, Covid) Nasopharyngeal Swab     Status: None   Collection Time: 05/22/21  1:41 AM   Specimen: Nasopharyngeal Swab; Nasopharyngeal(NP) swabs in vial transport medium  Result Value Ref Range Status   SARS Coronavirus 2 by RT PCR NEGATIVE NEGATIVE Final    Comment: (NOTE) SARS-CoV-2 target nucleic acids are NOT DETECTED.  The SARS-CoV-2 RNA is generally detectable in upper respiratory specimens during the acute phase of infection. The lowest concentration of SARS-CoV-2 viral copies this assay can detect is 138 copies/mL. A negative result does not preclude SARS-Cov-2 infection and should not be used as the sole basis for treatment or other patient management decisions. A negative result may occur with  improper  specimen collection/handling, submission of specimen other than nasopharyngeal swab, presence of viral mutation(s) within the areas targeted by this assay, and inadequate number of viral copies(<138 copies/mL). A negative result must be combined with clinical observations, patient history, and epidemiological information. The expected result is Negative.  Fact Sheet for Patients:  EntrepreneurPulse.com.au  Fact Sheet for Healthcare Providers:  IncredibleEmployment.be  This test is no t yet approved or cleared by the [place] FDA and  has been authorized for detection and/or diagnosis of SARS-CoV-2 by FDA under an Emergency Use Authorization (EUA). This EUA will remain  in effect (meaning this test can be used) for the duration of the COVID-19 declaration under Section 564(b)(1) of the Act, 21 U.S.C.section 360bbb-3(b)(1), unless the authorization is terminated  or revoked sooner.       Influenza A by PCR NEGATIVE NEGATIVE Final   Influenza B by PCR NEGATIVE NEGATIVE Final    Comment: (NOTE) The Xpert Xpress SARS-CoV-2/FLU/RSV plus assay is intended as an aid in the diagnosis of influenza from Nasopharyngeal swab specimens and should not be used as a sole basis for treatment. Nasal washings and aspirates are unacceptable for Xpert Xpress SARS-CoV-2/FLU/RSV testing.  Fact Sheet for Patients: EntrepreneurPulse.com.au  Fact Sheet for Healthcare Providers: IncredibleEmployment.be  This test is not yet approved or cleared by the [place] FDA and has been authorized for detection and/or diagnosis of SARS-CoV-2 by FDA under an Emergency Use Authorization (EUA). This EUA will remain in effect (meaning this test can be used) for the duration of the COVID-19 declaration under Section 564(b)(1) of the Act, 21 U.S.C. section 360bbb-3(b)(1), unless the authorization is terminated or revoked.  Performed at  [facility], [address] [place], [place] [address]   Blood culture (routine x 2)     Status: None (Preliminary result)   Collection Time: 05/22/21  5:26 AM   Specimen: BLOOD  Result Value Ref Range Status   Specimen Description BLOOD RIGHT ANTECUBITAL  Final   Special Requests   Final    BOTTLES DRAWN AEROBIC AND ANAEROBIC Blood Culture adequate volume   Culture   Final    NO GROWTH 1 DAY Performed at [facility], [address] [place], [place] [address]    Report Status PENDING  Incomplete  Blood culture (routine x 2)     Status: None (Preliminary result)   Collection Time: 05/22/21  7:57 AM   Specimen: BLOOD RIGHT HAND  Result Value Ref Range Status   Specimen Description BLOOD RIGHT HAND  Final   Special Requests   Final    BOTTLES DRAWN AEROBIC AND ANAEROBIC Blood Culture adequate volume   Culture   Final  NO GROWTH < 24 HOURS Performed at [facility], [place], [place] [address]    Report Status PENDING  Incomplete     Studies: CT Renal Stone Study  Result Date: 05/22/2021 CLINICAL DATA:  Bilateral flank pain. EXAM: CT ABDOMEN AND PELVIS WITHOUT CONTRAST TECHNIQUE: Multidetector CT imaging of the abdomen and pelvis was performed following the standard protocol without IV contrast. COMPARISON:  CT abdomen pelvis 03/04/2021 FINDINGS: Lower chest: No acute abnormality. Hepatobiliary: No focal liver abnormality. Status post cholecystectomy. No biliary dilatation. Pancreas: No focal lesion. Normal pancreatic contour. No surrounding inflammatory changes. No main pancreatic ductal dilatation. Spleen: Normal in size without focal abnormality. Adrenals/Urinary Tract: No adrenal nodule bilaterally. Interval development of marked asymmetric right perinephric fat stranding. Redemonstration of a 5.5 x 5.2 cm fluid density lesion within the right kidney that likely represents a simple renal cyst. No nephrolithiasis, no hydronephrosis,  and no contour-deforming renal mass. No ureterolithiasis or hydroureter. The urinary bladder is unremarkable. Stomach/Bowel: Stomach is within normal limits. No evidence of bowel wall thickening or dilatation. Diffuse sigmoid diverticulosis. The appendix is normal in caliber. Appendicoliths noted within the appendix. Vascular/Lymphatic: No abdominal aorta or iliac aneurysm. Mild atherosclerotic plaque of the aorta and its branches. No abdominal, pelvic, or inguinal lymphadenopathy. Reproductive: Uterus and bilateral adnexa are unremarkable. Other: No intraperitoneal free fluid. No intraperitoneal free gas. No organized fluid collection. Musculoskeletal: No abdominal aorta or iliac aneurysm. Mild atherosclerotic plaque of the aorta and its branches. No abdominal, pelvic, or inguinal lymphadenopathy. IMPRESSION: 1. Interval development of marked asymmetric right perinephric fat stranding. Recently identified punctate right nephrolithiasis no longer visualized. Findings may be due to recently passed stone. Correlate with urinalysis for infection. 2. Diffuse sigmoid diverticulosis with no acute diverticulitis. 3.  Aortic Atherosclerosis (ICD10-I70.0). Electronically Signed   By: Iven Finn M.D.   On: 05/22/2021 02:26    Scheduled Meds: . amitriptyline  25 mg Oral QHS  . enoxaparin (LOVENOX) injection  40 mg Subcutaneous Q24H  . oxybutynin  10 mg Oral QHS   Continuous Infusions: . cefTRIAXone (ROCEPHIN)  IV Stopped (05/23/21 0306)  . lactated ringers 100 mL/hr at 05/23/21 1248    Assessment/Plan:  1. Clinical sepsis, present on admission with right pyelonephritis, leukocytosis, fever of 101.6, tachycardia.  Klebsiella growing out of urine culture.  On Rocephin.  Continue IV fluids.  Check orthostatic vital signs.  Elevated procalcitonin to 3.26.  Thrombocytopenia of 137 and elevated INR.  Possible passed kidney stone. 2. Thrombocytopenia likely secondary to sepsis 3. Lactic acidosis of 2.0 on  presentation now has normalized 4. Depression on amitriptyline 5. Overactive bladder on oxybutynin 6. Morbid obesity with a BMI of 42.91        Code Status:     Code Status Orders  (From admission, onward)         Start     Ordered   05/22/21 0806  Full code  Continuous        05/22/21 0806        Code Status History    Date Active Date Inactive Code Status Order ID Comments User Context   02/10/2021 1141 02/12/2021 1850 Full Code [id number]  Tylene Fantasia, PA-C Inpatient   Advance Care Planning Activity     Family Communication: Spoke with patient's daughter on the phone Disposition Plan: Status is: Inpatient  Dispo: The patient is from: Home              Anticipated d/c is to:  Home              Patient currently being treated for sepsis with Klebsiella   Difficult to place patient.  No.  Antibiotics:  Rocephin  Time spent: 27 minutes  [place]

## 2021-05-24 DIAGNOSIS — D696 Thrombocytopenia, unspecified: Secondary | ICD-10-CM | POA: Diagnosis not present

## 2021-05-24 DIAGNOSIS — N10 Acute tubulo-interstitial nephritis: Secondary | ICD-10-CM | POA: Diagnosis not present

## 2021-05-24 DIAGNOSIS — E872 Acidosis: Secondary | ICD-10-CM | POA: Diagnosis not present

## 2021-05-24 DIAGNOSIS — A4159 Other Gram-negative sepsis: Secondary | ICD-10-CM | POA: Diagnosis not present

## 2021-05-24 LAB — BASIC METABOLIC PANEL
Anion gap: 6 (ref 5–15)
BUN: 15 mg/dL (ref 6–20)
CO2: 25 mmol/L (ref 22–32)
Calcium: 7.9 mg/dL — ABNORMAL LOW (ref 8.9–10.3)
Chloride: 102 mmol/L (ref 98–111)
Creatinine, Ser: 0.7 mg/dL (ref 0.44–1.00)
GFR, Estimated: >60 mL/min (ref >60)
Glucose, Bld: 120 mg/dL — ABNORMAL HIGH (ref 70–99)
Potassium: 3.8 mmol/L (ref 3.5–5.1)
Sodium: 133 mmol/L — ABNORMAL LOW (ref 135–145)

## 2021-05-24 LAB — CBC
HCT: 33.2 % — ABNORMAL LOW (ref 36.0–46.0)
Hemoglobin: 11.2 g/dL — ABNORMAL LOW (ref 12.0–15.0)
MCH: 29.8 pg (ref 26.0–34.0)
MCHC: 33.7 g/dL (ref 30.0–36.0)
MCV: 88.3 fL (ref 80.0–100.0)
Platelets: 115 10*3/uL — ABNORMAL LOW (ref 150–400)
RBC: 3.76 MIL/uL — ABNORMAL LOW (ref 3.87–5.11)
RDW: 14.6 % (ref 11.5–15.5)
WBC: 9.6 10*3/uL (ref 4.0–10.5)
nRBC: 0 % (ref 0.0–0.2)

## 2021-05-24 LAB — PROTIME-INR
INR: 1.2 (ref 0.8–1.2)
Prothrombin Time: 15.4 seconds — ABNORMAL HIGH (ref 11.4–15.2)

## 2021-05-24 LAB — URINE CULTURE: Culture: >=100000 — AB

## 2021-05-24 NOTE — Progress Notes (Signed)
Dr. Leslye Peer notified of elevated B/P and Temp and ordered to d/c fluids and continue to monitor.

## 2021-05-24 NOTE — Progress Notes (Signed)
Patient ID: Jasmine Franco, female   DOB: 1966-12-06, 55 y.o.   MRN: [id number] [facility] PROGRESS NOTE  Jasmine Franco GYK:[id number] DOB: 14-Nov-1966 DOA: 05/22/2021 PCP: Gladstone Lighter, MD  HPI/Subjective: Patient not feeling well.  Had fever last night and again this morning.  Still feels weak.  Admitted with sepsis and pyelonephritis.  She has a little back pain and right flank pain.  Objective: Vitals:   05/24/21 0433 05/24/21 0818  BP: 139/77 (!) 183/103  Pulse: 96 (!) 106  Resp: 20 18  Temp: 99.2 F (37.3 C) (!) 101.3 F (38.5 C)  SpO2: 97% 100%    Intake/Output Summary (Last 24 hours) at 05/24/2021 1352 Last data filed at 05/24/2021 0850 Gross per 24 hour  Intake 1095.94 ml  Output --  Net 1095.94 ml   Filed Weights   05/21/21 2139  Weight: 113.4 kg    ROS: Review of Systems  Respiratory: Negative for cough and shortness of breath.   Cardiovascular: Negative for chest pain.  Gastrointestinal: Positive for abdominal pain. Negative for nausea and vomiting.  Genitourinary: Positive for flank pain.   Exam: Physical Exam HENT:     Head: Normocephalic.     Mouth/Throat:     Pharynx: No oropharyngeal exudate.  Eyes:     General: Lids are normal.     Conjunctiva/sclera: Conjunctivae normal.     Pupils: Pupils are equal, round, and reactive to light.  Cardiovascular:     Rate and Rhythm: Normal rate and regular rhythm.     Heart sounds: Normal heart sounds, S1 normal and S2 normal.  Pulmonary:     Breath sounds: Normal breath sounds. No decreased breath sounds, wheezing or rhonchi.  Abdominal:     Palpations: Abdomen is soft.     Tenderness: There is no abdominal tenderness. There is right CVA tenderness.  Musculoskeletal:     Right ankle: No swelling.     Left ankle: No swelling.  Skin:    General: Skin is warm.     Findings: No rash.  Neurological:     Mental Status: She is alert and oriented to person, place, and time.       Data  Reviewed: Basic Metabolic Panel: Recent Labs  Lab 05/21/21 2144 05/23/21 0651 05/24/21 0539  NA 137 135 133*  K 4.0 4.0 3.8  CL 99 101 102  CO2 24 24 25   GLUCOSE 134* 108* 120*  BUN 19 19 15   CREATININE 0.92 1.07* 0.70  CALCIUM 8.7* 8.1* 7.9*   Liver Function Tests: Recent Labs  Lab 05/21/21 2144  AST 23  ALT 17  ALKPHOS 109  BILITOT 1.0  PROT 7.3  ALBUMIN 3.7   CBC: Recent Labs  Lab 05/21/21 2144 05/23/21 0651 05/24/21 0539  WBC 13.2* 14.0* 9.6  HGB 14.4 12.3 11.2*  HCT 42.8 36.9 33.2*  MCV 87.2 88.5 88.3  PLT 209 137* 115*   CBG: Recent Labs  Lab 05/22/21 2148  GLUCAP 126*    Recent Results (from the past 240 hour(s))  Urine culture     Status: Abnormal   Collection Time: 05/21/21  9:44 PM   Specimen: Urine, Random  Result Value Ref Range Status   Specimen Description   Final    URINE, RANDOM Performed at [facility], [address], [place], [place] [address]    Special Requests   Final    NONE Performed at [facility], [address], [place], [place] [address]    Culture >=  100,000 COLONIES/mL KLEBSIELLA PNEUMONIAE (A)  Final   Report Status 05/24/2021 FINAL  Final   Organism ID, Bacteria KLEBSIELLA PNEUMONIAE (A)  Final      Susceptibility   Klebsiella pneumoniae - MIC*    AMPICILLIN RESISTANT Resistant     CEFAZOLIN <=4 SENSITIVE Sensitive     CEFEPIME <=0.12 SENSITIVE Sensitive     CEFTRIAXONE <=0.25 SENSITIVE Sensitive     CIPROFLOXACIN <=0.25 SENSITIVE Sensitive     GENTAMICIN <=1 SENSITIVE Sensitive     IMIPENEM <=0.25 SENSITIVE Sensitive     NITROFURANTOIN 32 SENSITIVE Sensitive     TRIMETH/SULFA <=20 SENSITIVE Sensitive     AMPICILLIN/SULBACTAM 4 SENSITIVE Sensitive     PIP/TAZO <=4 SENSITIVE Sensitive     * >=100,000 COLONIES/mL KLEBSIELLA PNEUMONIAE  Resp Panel by RT-PCR (Flu A&B, Covid) Nasopharyngeal Swab     Status: None   Collection Time: 05/22/21  1:41 AM   Specimen: Nasopharyngeal Swab;  Nasopharyngeal(NP) swabs in vial transport medium  Result Value Ref Range Status   SARS Coronavirus 2 by RT PCR NEGATIVE NEGATIVE Final    Comment: (NOTE) SARS-CoV-2 target nucleic acids are NOT DETECTED.  The SARS-CoV-2 RNA is generally detectable in upper respiratory specimens during the acute phase of infection. The lowest concentration of SARS-CoV-2 viral copies this assay can detect is 138 copies/mL. A negative result does not preclude SARS-Cov-2 infection and should not be used as the sole basis for treatment or other patient management decisions. A negative result may occur with  improper specimen collection/handling, submission of specimen other than nasopharyngeal swab, presence of viral mutation(s) within the areas targeted by this assay, and inadequate number of viral copies(<138 copies/mL). A negative result must be combined with clinical observations, patient history, and epidemiological information. The expected result is Negative.  Fact Sheet for Patients:  EntrepreneurPulse.com.au  Fact Sheet for Healthcare Providers:  IncredibleEmployment.be  This test is no t yet approved or cleared by the [place] FDA and  has been authorized for detection and/or diagnosis of SARS-CoV-2 by FDA under an Emergency Use Authorization (EUA). This EUA will remain  in effect (meaning this test can be used) for the duration of the COVID-19 declaration under Section 564(b)(1) of the Act, 21 U.S.C.section 360bbb-3(b)(1), unless the authorization is terminated  or revoked sooner.       Influenza A by PCR NEGATIVE NEGATIVE Final   Influenza B by PCR NEGATIVE NEGATIVE Final    Comment: (NOTE) The Xpert Xpress SARS-CoV-2/FLU/RSV plus assay is intended as an aid in the diagnosis of influenza from Nasopharyngeal swab specimens and should not be used as a sole basis for treatment. Nasal washings and aspirates are unacceptable for Xpert Xpress  SARS-CoV-2/FLU/RSV testing.  Fact Sheet for Patients: EntrepreneurPulse.com.au  Fact Sheet for Healthcare Providers: IncredibleEmployment.be  This test is not yet approved or cleared by the [place] FDA and has been authorized for detection and/or diagnosis of SARS-CoV-2 by FDA under an Emergency Use Authorization (EUA). This EUA will remain in effect (meaning this test can be used) for the duration of the COVID-19 declaration under Section 564(b)(1) of the Act, 21 U.S.C. section 360bbb-3(b)(1), unless the authorization is terminated or revoked.  Performed at [facility], [address] [place], [place] [address]   Blood culture (routine x 2)     Status: None (Preliminary result)   Collection Time: 05/22/21  5:26 AM   Specimen: BLOOD  Result Value Ref Range Status   Specimen Description BLOOD RIGHT ANTECUBITAL  Final  Special Requests   Final    BOTTLES DRAWN AEROBIC AND ANAEROBIC Blood Culture adequate volume   Culture   Final    NO GROWTH 2 DAYS Performed at [facility], [address] [place], [place] [address]    Report Status PENDING  Incomplete  Blood culture (routine x 2)     Status: None (Preliminary result)   Collection Time: 05/22/21  7:57 AM   Specimen: BLOOD RIGHT HAND  Result Value Ref Range Status   Specimen Description BLOOD RIGHT HAND  Final   Special Requests   Final    BOTTLES DRAWN AEROBIC AND ANAEROBIC Blood Culture adequate volume   Culture   Final    NO GROWTH 2 DAYS Performed at [facility], [address] [place], [place] [address]    Report Status PENDING  Incomplete      Scheduled Meds: . amitriptyline  25 mg Oral QHS  . enoxaparin (LOVENOX) injection  40 mg Subcutaneous Q24H  . oxybutynin  10 mg Oral QHS   Continuous Infusions: . cefTRIAXone (ROCEPHIN)  IV Stopped (05/24/21 0205)    Assessment/Plan:  1. Clinical severe sepsis, present on admission with  right pyelonephritis.  Acute kidney injury with creatinine 1.07 yesterday and down to 0.7 today with IV fluids.  Patient still spiking fevers above 101.  Klebsiella growing out of urine culture.  Continue Rocephin.  Patient also has thrombocytopenia and elevated INR. 2. Acute kidney injury.  Creatinine 1.07 yesterday and down to 0.7 today with IV fluid hydration. 3. Thrombocytopenia secondary to sepsis 4. Lactic acidosis secondary to sepsis 5. Depression on amitriptyline 6. Overactive bladder on oxybutynin 7. Morbid obesity with a BMI of 42.91 8. Encouraged ambulation    Code Status:     Code Status Orders  (From admission, onward)         Start     Ordered   05/22/21 0806  Full code  Continuous        05/22/21 0806        Code Status History    Date Active Date Inactive Code Status Order ID Comments User Context   02/10/2021 1141 02/12/2021 1850 Full Code [id number]  Tylene Fantasia, PA-C Inpatient   Advance Care Planning Activity     Family Communication: Spoke with patient's daughter on the phone Disposition Plan: Status is: Inpatient  Dispo: The patient is from: Home              Anticipated d/c is to: Home              Patient currently still spiking fevers over 101   Difficult to place patient.  No.  Antibiotics:  Rocephin  Time spent: 27 minutes  [place]

## 2021-05-24 NOTE — Plan of Care (Signed)
Continuing with plan of care. 

## 2021-05-25 DIAGNOSIS — N179 Acute kidney failure, unspecified: Secondary | ICD-10-CM

## 2021-05-25 DIAGNOSIS — N10 Acute tubulo-interstitial nephritis: Secondary | ICD-10-CM | POA: Diagnosis not present

## 2021-05-25 DIAGNOSIS — A419 Sepsis, unspecified organism: Secondary | ICD-10-CM | POA: Diagnosis not present

## 2021-05-25 DIAGNOSIS — A4159 Other Gram-negative sepsis: Secondary | ICD-10-CM | POA: Diagnosis not present

## 2021-05-25 DIAGNOSIS — R6520 Severe sepsis without septic shock: Secondary | ICD-10-CM

## 2021-05-25 LAB — FIBRINOGEN: Fibrinogen: >750 mg/dL — ABNORMAL HIGH (ref 210–475)

## 2021-05-25 LAB — CBC WITH DIFFERENTIAL/PLATELET
Abs Immature Granulocytes: 0.04 10*3/uL (ref 0.00–0.07)
Basophils Absolute: 0 10*3/uL (ref 0.0–0.1)
Basophils Relative: 0 %
Eosinophils Absolute: 0.1 10*3/uL (ref 0.0–0.5)
Eosinophils Relative: 1 %
HCT: 30.3 % — ABNORMAL LOW (ref 36.0–46.0)
Hemoglobin: 10.4 g/dL — ABNORMAL LOW (ref 12.0–15.0)
Immature Granulocytes: 1 %
Lymphocytes Relative: 7 %
Lymphs Abs: 0.5 10*3/uL — ABNORMAL LOW (ref 0.7–4.0)
MCH: 30 pg (ref 26.0–34.0)
MCHC: 34.3 g/dL (ref 30.0–36.0)
MCV: 87.3 fL (ref 80.0–100.0)
Monocytes Absolute: 0.4 10*3/uL (ref 0.1–1.0)
Monocytes Relative: 6 %
Neutro Abs: 6.3 10*3/uL (ref 1.7–7.7)
Neutrophils Relative %: 85 %
Platelets: 111 10*3/uL — ABNORMAL LOW (ref 150–400)
RBC: 3.47 MIL/uL — ABNORMAL LOW (ref 3.87–5.11)
RDW: 14.6 % (ref 11.5–15.5)
WBC: 7.3 10*3/uL (ref 4.0–10.5)
nRBC: 0 % (ref 0.0–0.2)

## 2021-05-25 LAB — PROCALCITONIN: Procalcitonin: 1.39 ng/mL

## 2021-05-25 LAB — D-DIMER, QUANTITATIVE: D-Dimer, Quant: 2.49 ug/mL-FEU — ABNORMAL HIGH (ref 0.00–0.50)

## 2021-05-25 MED ORDER — MAGNESIUM SULFATE 2 GM/50ML IV SOLN
2.0000 g | Freq: Once | INTRAVENOUS | Status: AC
  Administered 2021-05-25: 2 g via INTRAVENOUS
  Filled 2021-05-25: qty 50

## 2021-05-25 MED ORDER — SODIUM CHLORIDE 0.9 % IV SOLN
2.0000 g | INTRAVENOUS | Status: DC
  Administered 2021-05-26 – 2021-05-27 (×2): 2 g via INTRAVENOUS
  Filled 2021-05-25: qty 20
  Filled 2021-05-25 (×2): qty 2

## 2021-05-25 MED ORDER — IBUPROFEN 400 MG PO TABS
600.0000 mg | ORAL_TABLET | Freq: Every day | ORAL | Status: DC
  Administered 2021-05-25 – 2021-05-27 (×3): 600 mg via ORAL
  Filled 2021-05-25 (×3): qty 2

## 2021-05-25 NOTE — Progress Notes (Signed)
Patient ID: Jasmine Franco, female   DOB: 1965-12-28, 55 y.o.   MRN: [id number] [facility] PROGRESS NOTE  Jasmine Franco EZM:[id number] DOB: [birth year] DOA: 05/22/2021 PCP: Gladstone Lighter, MD  HPI/Subjective: Patient starting to feel little bit better today.  Had a headache this morning.  Still having some pain in the right back.  No nausea or vomiting.  Admitted with sepsis and pyelonephritis.  Objective: Vitals:   05/25/21 1207 05/25/21 1315  BP: 136/90   Pulse: 97   Resp: 20   Temp: (!) 101.5 F (38.6 C) 99.1 F (37.3 C)  SpO2: 90%     Intake/Output Summary (Last 24 hours) at 05/25/2021 1425 Last data filed at 05/25/2021 1300 Gross per 24 hour  Intake 360 ml  Output 0 ml  Net 360 ml   Filed Weights   05/21/21 2139  Weight: 113.4 kg    ROS: Review of Systems  Respiratory: Negative for shortness of breath.   Cardiovascular: Negative for chest pain.  Gastrointestinal: Positive for abdominal pain. Negative for nausea and vomiting.  Neurological: Positive for headaches.   Exam: Physical Exam HENT:     Head: Normocephalic.     Mouth/Throat:     Pharynx: No oropharyngeal exudate.  Eyes:     General: Lids are normal.     Conjunctiva/sclera: Conjunctivae normal.     Pupils: Pupils are equal, round, and reactive to light.  Cardiovascular:     Rate and Rhythm: Normal rate and regular rhythm.     Heart sounds: Normal heart sounds, S1 normal and S2 normal.  Pulmonary:     Breath sounds: Normal breath sounds. No decreased breath sounds, wheezing, rhonchi or rales.  Abdominal:     Palpations: Abdomen is soft.     Tenderness: There is no abdominal tenderness.  Musculoskeletal:     Right lower leg: Swelling present.     Left lower leg: Swelling present.  Skin:    General: Skin is warm.     Findings: No rash.  Neurological:     Mental Status: She is alert and oriented to person, place, and time.       Data Reviewed: Basic Metabolic Panel: Recent Labs   Lab 05/21/21 2144 05/23/21 0651 05/24/21 0539  NA 137 135 133*  K 4.0 4.0 3.8  CL 99 101 102  CO2 24 24 25   GLUCOSE 134* 108* 120*  BUN 19 19 15   CREATININE 0.92 1.07* 0.70  CALCIUM 8.7* 8.1* 7.9*   Liver Function Tests: Recent Labs  Lab 05/21/21 2144  AST 23  ALT 17  ALKPHOS 109  BILITOT 1.0  PROT 7.3  ALBUMIN 3.7   CBC: Recent Labs  Lab 05/21/21 2144 05/23/21 0651 05/24/21 0539 05/25/21 0433  WBC 13.2* 14.0* 9.6 7.3  NEUTROABS  --   --   --  6.3  HGB 14.4 12.3 11.2* 10.4*  HCT 42.8 36.9 33.2* 30.3*  MCV 87.2 88.5 88.3 87.3  PLT 209 137* 115* 111*    CBG: Recent Labs  Lab 05/22/21 2148  GLUCAP 126*    Recent Results (from the past 240 hour(s))  Urine culture     Status: Abnormal   Collection Time: 05/21/21  9:44 PM   Specimen: Urine, Random  Result Value Ref Range Status   Specimen Description   Final    URINE, RANDOM Performed at [facility], [address] [place], [place] [address]    Special Requests   Final    NONE Performed at  [facility], [address], [place], [place] [address]    Culture >=100,000 COLONIES/mL KLEBSIELLA PNEUMONIAE (A)  Final   Report Status 05/24/2021 FINAL  Final   Organism ID, Bacteria KLEBSIELLA PNEUMONIAE (A)  Final      Susceptibility   Klebsiella pneumoniae - MIC*    AMPICILLIN RESISTANT Resistant     CEFAZOLIN <=4 SENSITIVE Sensitive     CEFEPIME <=0.12 SENSITIVE Sensitive     CEFTRIAXONE <=0.25 SENSITIVE Sensitive     CIPROFLOXACIN <=0.25 SENSITIVE Sensitive     GENTAMICIN <=1 SENSITIVE Sensitive     IMIPENEM <=0.25 SENSITIVE Sensitive     NITROFURANTOIN 32 SENSITIVE Sensitive     TRIMETH/SULFA <=20 SENSITIVE Sensitive     AMPICILLIN/SULBACTAM 4 SENSITIVE Sensitive     PIP/TAZO <=4 SENSITIVE Sensitive     * >=100,000 COLONIES/mL KLEBSIELLA PNEUMONIAE  Resp Panel by RT-PCR (Flu A&B, Covid) Nasopharyngeal Swab     Status: None   Collection Time: 05/22/21  1:41 AM   Specimen:  Nasopharyngeal Swab; Nasopharyngeal(NP) swabs in vial transport medium  Result Value Ref Range Status   SARS Coronavirus 2 by RT PCR NEGATIVE NEGATIVE Final    Comment: (NOTE) SARS-CoV-2 target nucleic acids are NOT DETECTED.  The SARS-CoV-2 RNA is generally detectable in upper respiratory specimens during the acute phase of infection. The lowest concentration of SARS-CoV-2 viral copies this assay can detect is 138 copies/mL. A negative result does not preclude SARS-Cov-2 infection and should not be used as the sole basis for treatment or other patient management decisions. A negative result may occur with  improper specimen collection/handling, submission of specimen other than nasopharyngeal swab, presence of viral mutation(s) within the areas targeted by this assay, and inadequate number of viral copies(<138 copies/mL). A negative result must be combined with clinical observations, patient history, and epidemiological information. The expected result is Negative.  Fact Sheet for Patients:  EntrepreneurPulse.com.au  Fact Sheet for Healthcare Providers:  IncredibleEmployment.be  This test is no t yet approved or cleared by the [place] FDA and  has been authorized for detection and/or diagnosis of SARS-CoV-2 by FDA under an Emergency Use Authorization (EUA). This EUA will remain  in effect (meaning this test can be used) for the duration of the COVID-19 declaration under Section 564(b)(1) of the Act, 21 U.S.C.section 360bbb-3(b)(1), unless the authorization is terminated  or revoked sooner.       Influenza A by PCR NEGATIVE NEGATIVE Final   Influenza B by PCR NEGATIVE NEGATIVE Final    Comment: (NOTE) The Xpert Xpress SARS-CoV-2/FLU/RSV plus assay is intended as an aid in the diagnosis of influenza from Nasopharyngeal swab specimens and should not be used as a sole basis for treatment. Nasal washings and aspirates are unacceptable for  Xpert Xpress SARS-CoV-2/FLU/RSV testing.  Fact Sheet for Patients: EntrepreneurPulse.com.au  Fact Sheet for Healthcare Providers: IncredibleEmployment.be  This test is not yet approved or cleared by the [place] FDA and has been authorized for detection and/or diagnosis of SARS-CoV-2 by FDA under an Emergency Use Authorization (EUA). This EUA will remain in effect (meaning this test can be used) for the duration of the COVID-19 declaration under Section 564(b)(1) of the Act, 21 U.S.C. section 360bbb-3(b)(1), unless the authorization is terminated or revoked.  Performed at [facility], [address] [place], [place] [address]   Blood culture (routine x 2)     Status: None (Preliminary result)   Collection Time: 05/22/21  5:26 AM   Specimen: BLOOD  Result  Value Ref Range Status   Specimen Description BLOOD RIGHT ANTECUBITAL  Final   Special Requests   Final    BOTTLES DRAWN AEROBIC AND ANAEROBIC Blood Culture adequate volume   Culture   Final    NO GROWTH 3 DAYS Performed at [facility], [address], [place], [place] [address]    Report Status PENDING  Incomplete  Blood culture (routine x 2)     Status: None (Preliminary result)   Collection Time: 05/22/21  7:57 AM   Specimen: BLOOD RIGHT HAND  Result Value Ref Range Status   Specimen Description BLOOD RIGHT HAND  Final   Special Requests   Final    BOTTLES DRAWN AEROBIC AND ANAEROBIC Blood Culture adequate volume   Culture   Final    NO GROWTH 3 DAYS Performed at [facility], [address] [place], [place] [address]    Report Status PENDING  Incomplete      Scheduled Meds: . amitriptyline  25 mg Oral QHS  . enoxaparin (LOVENOX) injection  40 mg Subcutaneous Q24H  . ibuprofen  600 mg Oral Daily  . oxybutynin  10 mg Oral QHS   Continuous Infusions: . [START ON 05/26/2021] cefTRIAXone (ROCEPHIN)  IV      Assessment/Plan:  1.  Severe  sepsis, present on admission with right pyelonephritis.  Patient still spiking fevers above 101.  Klebsiella growing out of the urine culture.  Continue Rocephin but increase to 2 g daily.  Patient still has thrombocytopenia.  No signs of DIC. 2.  Acute kidney injury.  Creatinine improved from 1.07 down to 0.7 with IV fluid hydration.  Discontinue fluids today.  This issue has resolved. 3.  Thrombocytopenia secondary to sepsis.  Platelet count 111 today. 4.  Lactic acidosis secondary to sepsis. 5.  Depression continue amitriptyline. 6.  Overactive bladder on oxybutynin 7.  Morbid obesity with a BMI of 42.91 8.  Headache ordered ibuprofen and magnesium.      Code Status:     Code Status Orders  (From admission, onward)         Start     Ordered   05/22/21 0806  Full code  Continuous        05/22/21 0806        Code Status History    Date Active Date Inactive Code Status Order ID Comments User Context   02/10/2021 1141 02/12/2021 1850 Full Code [id number]  Tylene Fantasia, PA-C Inpatient   Advance Care Planning Activity     Family Communication: Spoke with patient's daughter on the phone Disposition Plan: Status is: Inpatient  Dispo: The patient is from: Home              Anticipated d/c is to: Home once afebrile              Patient currently still spiking fevers above 101.  Would like to watch 24 hours after breaks temperature.   Difficult to place patient.  No  Time spent: 26 minutes  [place]

## 2021-05-26 ENCOUNTER — Inpatient Hospital Stay: Payer: 59

## 2021-05-26 ENCOUNTER — Encounter: Payer: Self-pay | Admitting: Internal Medicine

## 2021-05-26 DIAGNOSIS — N12 Tubulo-interstitial nephritis, not specified as acute or chronic: Secondary | ICD-10-CM | POA: Diagnosis not present

## 2021-05-26 DIAGNOSIS — H539 Unspecified visual disturbance: Secondary | ICD-10-CM

## 2021-05-26 DIAGNOSIS — D696 Thrombocytopenia, unspecified: Secondary | ICD-10-CM | POA: Diagnosis not present

## 2021-05-26 DIAGNOSIS — A4159 Other Gram-negative sepsis: Secondary | ICD-10-CM | POA: Diagnosis not present

## 2021-05-26 DIAGNOSIS — R059 Cough, unspecified: Secondary | ICD-10-CM

## 2021-05-26 LAB — CBC
HCT: 30.8 % — ABNORMAL LOW (ref 36.0–46.0)
Hemoglobin: 10.3 g/dL — ABNORMAL LOW (ref 12.0–15.0)
MCH: 29.3 pg (ref 26.0–34.0)
MCHC: 33.4 g/dL (ref 30.0–36.0)
MCV: 87.7 fL (ref 80.0–100.0)
Platelets: 145 10*3/uL — ABNORMAL LOW (ref 150–400)
RBC: 3.51 MIL/uL — ABNORMAL LOW (ref 3.87–5.11)
RDW: 14.6 % (ref 11.5–15.5)
WBC: 6.2 10*3/uL (ref 4.0–10.5)
nRBC: 0 % (ref 0.0–0.2)

## 2021-05-26 LAB — BASIC METABOLIC PANEL
Anion gap: 9 (ref 5–15)
BUN: 8 mg/dL (ref 6–20)
CO2: 26 mmol/L (ref 22–32)
Calcium: 7.7 mg/dL — ABNORMAL LOW (ref 8.9–10.3)
Chloride: 102 mmol/L (ref 98–111)
Creatinine, Ser: 0.55 mg/dL (ref 0.44–1.00)
GFR, Estimated: >60 mL/min (ref >60)
Glucose, Bld: 116 mg/dL — ABNORMAL HIGH (ref 70–99)
Potassium: 3.1 mmol/L — ABNORMAL LOW (ref 3.5–5.1)
Sodium: 137 mmol/L (ref 135–145)

## 2021-05-26 LAB — D-DIMER, QUANTITATIVE: D-Dimer, Quant: 3.1 ug/mL-FEU — ABNORMAL HIGH (ref 0.00–0.50)

## 2021-05-26 LAB — C-REACTIVE PROTEIN: CRP: 23.2 mg/dL — ABNORMAL HIGH (ref <1.0)

## 2021-05-26 LAB — SEDIMENTATION RATE: Sed Rate: 86 mm/hr — ABNORMAL HIGH (ref 0–30)

## 2021-05-26 LAB — TROPONIN I (HIGH SENSITIVITY)
Troponin I (High Sensitivity): 50 ng/L — ABNORMAL HIGH (ref <18)
Troponin I (High Sensitivity): 44 ng/L — ABNORMAL HIGH (ref <18)

## 2021-05-26 MED ORDER — IPRATROPIUM-ALBUTEROL 0.5-2.5 (3) MG/3ML IN SOLN
3.0000 mL | Freq: Four times a day (QID) | RESPIRATORY_TRACT | Status: DC
  Administered 2021-05-26 (×2): 3 mL via RESPIRATORY_TRACT
  Filled 2021-05-26 (×2): qty 3

## 2021-05-26 MED ORDER — POTASSIUM CHLORIDE CRYS ER 20 MEQ PO TBCR
40.0000 meq | EXTENDED_RELEASE_TABLET | Freq: Once | ORAL | Status: AC
  Administered 2021-05-26: 40 meq via ORAL
  Filled 2021-05-26: qty 2

## 2021-05-26 MED ORDER — IPRATROPIUM-ALBUTEROL 0.5-2.5 (3) MG/3ML IN SOLN
3.0000 mL | Freq: Three times a day (TID) | RESPIRATORY_TRACT | Status: DC
  Administered 2021-05-27: 3 mL via RESPIRATORY_TRACT
  Filled 2021-05-26: qty 3

## 2021-05-26 MED ORDER — MORPHINE SULFATE (PF) 2 MG/ML IV SOLN: 1.0000 mg | INTRAVENOUS | Status: DC | PRN

## 2021-05-26 MED ORDER — GUAIFENESIN 100 MG/5ML PO SOLN
5.0000 mL | ORAL | Status: DC | PRN
  Administered 2021-05-26: 100 mg via ORAL
  Filled 2021-05-26 (×3): qty 5

## 2021-05-26 MED ORDER — BENZONATATE 100 MG PO CAPS
200.0000 mg | ORAL_CAPSULE | Freq: Two times a day (BID) | ORAL | Status: DC | PRN
  Administered 2021-05-26 (×2): 200 mg via ORAL
  Filled 2021-05-26 (×2): qty 2

## 2021-05-26 MED ORDER — AZITHROMYCIN 250 MG PO TABS
250.0000 mg | ORAL_TABLET | Freq: Every day | ORAL | Status: DC
  Administered 2021-05-27: 250 mg via ORAL
  Filled 2021-05-26: qty 1

## 2021-05-26 MED ORDER — OXYCODONE HCL 5 MG PO TABS: 5.0000 mg | ORAL_TABLET | ORAL | Status: DC | PRN

## 2021-05-26 MED ORDER — MAGNESIUM SULFATE 2 GM/50ML IV SOLN
2.0000 g | Freq: Once | INTRAVENOUS | Status: AC
  Administered 2021-05-26: 2 g via INTRAVENOUS
  Filled 2021-05-26: qty 50

## 2021-05-26 MED ORDER — AZITHROMYCIN 250 MG PO TABS
500.0000 mg | ORAL_TABLET | Freq: Every day | ORAL | Status: AC
  Administered 2021-05-26: 500 mg via ORAL
  Filled 2021-05-26: qty 2

## 2021-05-26 NOTE — Progress Notes (Signed)
Patient reports that she is having pain in her right thigh radiating into right hip. Denies pain, swelling, or redness to area.  Also reports right frontal headache and vision changes. Will alert Dr. Leslye Peer.  Daughter at bedside and states symptoms have been present since patient has been spiking a fever.

## 2021-05-26 NOTE — Consult Note (Signed)
Reason for Consult:blurredd vision OU Referring Physician: Henry Demeritt is an 55 y.o. female.  Chief complaint: blurred vision bilaterally for 1 day Severe sepsis [facility])  HPI: 55 yo WF admitted Friday (5/27) for pyelonephritis and sepsis.  Patinet complains of decreased vision bilaterally for approx 1 day, near greater than distance.  Actually her OTC readers provide adequate distance vision currently.  Has recent headaches and leg soreness but only for several days with her illness.  None prior to the recent week.  Fever and night sweats for several days.  She describes no diplopia and no transient visual loss or obscurations. She is not diabetic and BS has been mildly elevated (116, 120) since admission.  Was treated with PO prednisone approx 1 week prior to admission.  Vision was normal at admission, but only blurry starting 1 day ago. ESR and CRP were markedly elevated today.  Past Medical History:  Diagnosis Date  . Cancer ([place])     ROS  Past Surgical History:  Procedure Laterality Date  . CHOLECYSTECTOMY      Family History  Problem Relation Age of Onset  . Diabetes Mother     Social History:  reports that she has never smoked. She has never used smokeless tobacco. She reports that she does not drink alcohol and does not use drugs.  Allergies:  Allergies  Allergen Reactions  . Cortisone Other (See Comments)    redness    Medications:  Prior to Admission:  Medications Prior to Admission  Medication Sig Dispense Refill Last Dose  . amitriptyline (ELAVIL) 25 MG tablet Take 25 mg by mouth at bedtime.   05/20/2021 at 2100  . cyclobenzaprine (FLEXERIL) 10 MG tablet Take 10 mg by mouth at bedtime as needed for muscle spasms.   prn at prn  . desonide (DESOWEN) 0.05 % cream Apply topically 2 (two) times daily.   prn at prn  . etodolac (LODINE XL) 400 MG 24 hr tablet Take 1 tablet (400 mg total) by mouth daily. 90 tablet 3 05/21/2021 at 0600  . oxybutynin  (DITROPAN-XL) 10 MG 24 hr tablet Take 10 mg by mouth daily.   05/20/2021 at 2100  . gabapentin (NEURONTIN) 100 MG capsule Take by mouth. (Patient not taking: Reported on 05/22/2021)   Not Taking at Unknown time    Results for orders placed or performed during the hospital encounter of 05/22/21 (from the past 48 hour(s))  Fibrinogen     Status: Abnormal   Collection Time: 05/25/21  4:33 AM  Result Value Ref Range   Fibrinogen >750 (H) 210 - 475 mg/dL    Comment: Performed at [facility], [address] [place], [place] [address]  D-dimer, quantitative     Status: Abnormal   Collection Time: 05/25/21  4:33 AM  Result Value Ref Range   D-Dimer, Quant 2.49 (H) 0.00 - 0.50 ug/mL-FEU    Comment: (NOTE) At the manufacturer cut-off value of 0.5 g/mL FEU, this assay has a negative predictive value of 95-100%.This assay is intended for use in conjunction with a clinical pretest probability (PTP) assessment model to exclude pulmonary embolism (PE) and deep venous thrombosis (DVT) in outpatients suspected of PE or DVT. Results should be correlated with clinical presentation. Performed at [facility], [address] [place], [place] [address]   Procalcitonin     Status: None   Collection Time: 05/25/21  4:33 AM  Result Value Ref Range   Procalcitonin 1.39 ng/mL    Comment:  Interpretation: PCT > 0.5 ng/mL and <= 2 ng/mL: Systemic infection (sepsis) is possible, but other conditions are known to elevate PCT as well. (NOTE)       Sepsis PCT Algorithm           Lower Respiratory Tract                                      Infection PCT Algorithm    ----------------------------     ----------------------------         PCT < 0.25 ng/mL                PCT < 0.10 ng/mL          Strongly encourage             Strongly discourage   discontinuation of antibiotics    initiation of antibiotics    ----------------------------     -----------------------------       PCT  0.25 - 0.50 ng/mL            PCT 0.10 - 0.25 ng/mL               OR       >80% decrease in PCT            Discourage initiation of                                            antibiotics      Encourage discontinuation           of antibiotics    ----------------------------     -----------------------------         PCT >= 0.50 ng/mL              PCT 0.26 - 0.50 ng/mL                AND       <80% decrease in PCT             Encourage initiation of                                             antibiotics       Encourage continuation           of antibiotics    ----------------------------     -----------------------------        PCT >= 0.50 ng/mL                  PCT > 0.50 ng/mL               AND         increase in PCT                  Strongly encourage                                      initiation of antibiotics    Strongly encourage escalation           of antibiotics                                     -----------------------------  PCT <= 0.25 ng/mL                                                 OR                                        > 80% decrease in PCT                                      Discontinue / Do not initiate                                             antibiotics  Performed at [facility], [address] [place], [place] [address]   CBC with Differential/Platelet     Status: Abnormal   Collection Time: 05/25/21  4:33 AM  Result Value Ref Range   WBC 7.3 4.0 - 10.5 K/uL   RBC 3.47 (L) 3.87 - 5.11 MIL/uL   Hemoglobin 10.4 (L) 12.0 - 15.0 g/dL   HCT 30.3 (L) 36.0 - 46.0 %   MCV 87.3 80.0 - 100.0 fL   MCH 30.0 26.0 - 34.0 pg   MCHC 34.3 30.0 - 36.0 g/dL   RDW 14.6 11.5 - 15.5 %   Platelets 111 (L) 150 - 400 K/uL    Comment: Immature Platelet Fraction may be clinically indicated, consider ordering this additional test [id number]    nRBC 0.0 0.0 - 0.2 %   Neutrophils Relative % 85 %   Neutro Abs 6.3  1.7 - 7.7 K/uL   Lymphocytes Relative 7 %   Lymphs Abs 0.5 (L) 0.7 - 4.0 K/uL   Monocytes Relative 6 %   Monocytes Absolute 0.4 0.1 - 1.0 K/uL   Eosinophils Relative 1 %   Eosinophils Absolute 0.1 0.0 - 0.5 K/uL   Basophils Relative 0 %   Basophils Absolute 0.0 0.0 - 0.1 K/uL   Immature Granulocytes 1 %   Abs Immature Granulocytes 0.04 0.00 - 0.07 K/uL    Comment: Performed at [facility], [address] [place], [place] [address]  CBC     Status: Abnormal   Collection Time: 05/26/21  6:34 AM  Result Value Ref Range   WBC 6.2 4.0 - 10.5 K/uL   RBC 3.51 (L) 3.87 - 5.11 MIL/uL   Hemoglobin 10.3 (L) 12.0 - 15.0 g/dL   HCT 30.8 (L) 36.0 - 46.0 %   MCV 87.7 80.0 - 100.0 fL   MCH 29.3 26.0 - 34.0 pg   MCHC 33.4 30.0 - 36.0 g/dL   RDW 14.6 11.5 - 15.5 %   Platelets 145 (L) 150 - 400 K/uL   nRBC 0.0 0.0 - 0.2 %    Comment: Performed at [facility], [address], [place], [place] [address]  Basic metabolic panel     Status: Abnormal   Collection Time: 05/26/21  6:34 AM  Result Value Ref Range   Sodium 137 135 - 145 mmol/L   Potassium 3.1 (L) 3.5 - 5.1 mmol/L   Chloride 102 98 -  111 mmol/L   CO2 26 22 - 32 mmol/L   Glucose, Bld 116 (H) 70 - 99 mg/dL    Comment: Glucose reference range applies only to samples taken after fasting for at least 8 hours.   BUN 8 6 - 20 mg/dL   Creatinine, Ser 0.55 0.44 - 1.00 mg/dL   Calcium 7.7 (L) 8.9 - 10.3 mg/dL   GFR, Estimated >60 >60 mL/min    Comment: (NOTE) Calculated using the CKD-EPI Creatinine Equation (2021)    Anion gap 9 5 - 15    Comment: Performed at [facility], [address] [place], [place] [address]  D-dimer, quantitative     Status: Abnormal   Collection Time: 05/26/21  6:34 AM  Result Value Ref Range   D-Dimer, Quant 3.10 (H) 0.00 - 0.50 ug/mL-FEU    Comment: (NOTE) At the manufacturer cut-off value of 0.5 g/mL FEU, this assay has a negative predictive value of 95-100%.This assay is  intended for use in conjunction with a clinical pretest probability (PTP) assessment model to exclude pulmonary embolism (PE) and deep venous thrombosis (DVT) in outpatients suspected of PE or DVT. Results should be correlated with clinical presentation. Performed at [facility], [place], [place] [address]   Troponin I (High Sensitivity)     Status: Abnormal   Collection Time: 05/26/21  6:34 AM  Result Value Ref Range   Troponin I (High Sensitivity) 50 (H) <18 ng/L    Comment: (NOTE) Elevated high sensitivity troponin I (hsTnI) values and significant  changes across serial measurements may suggest ACS but many other  chronic and acute conditions are known to elevate hsTnI results.  Refer to the "Links" section for chest pain algorithms and additional  guidance. Performed at [facility], [address] [place], [place] [address]   Sedimentation rate     Status: Abnormal   Collection Time: 05/26/21  6:34 AM  Result Value Ref Range   Sed Rate 86 (H) 0 - 30 mm/hr    Comment: Performed at [facility], [place], [place] [address]  C-reactive protein     Status: Abnormal   Collection Time: 05/26/21  6:34 AM  Result Value Ref Range   CRP 23.2 (H) <1.0 mg/dL    Comment: Performed at [facility] [address], [place], [place] [address]  Troponin I (High Sensitivity)     Status: Abnormal   Collection Time: 05/26/21  8:39 AM  Result Value Ref Range   Troponin I (High Sensitivity) 44 (H) <18 ng/L    Comment: (NOTE) Elevated high sensitivity troponin I (hsTnI) values and significant  changes across serial measurements may suggest ACS but many other  chronic and acute conditions are known to elevate hsTnI results.  Refer to the "Links" section for chest pain algorithms and additional  guidance. Performed at [facility], [address] [place], [place] [address]     CT HEAD WO CONTRAST  Result Date:  05/26/2021 CLINICAL DATA:  Headache, new or worsening. EXAM: CT HEAD WITHOUT CONTRAST TECHNIQUE: Contiguous axial images were obtained from the base of the skull through the vertex without intravenous contrast. COMPARISON:  February 24, 2021. FINDINGS: Brain: No evidence of acute infarction, hemorrhage, hydrocephalus, extra-axial collection or mass lesion/mass effect. Vascular: No hyperdense vessel. Skull: No acute fracture. Sinuses/Orbits: Clear visualized sinuses.  Unremarkable orbits. Other: No mastoid effusions. IMPRESSION: No evidence of acute intracranial abnormality. Electronically Signed   By: Roslynn Amble  Ronnald Ramp MD   On: 05/26/2021 10:42   US Venous Img Lower Bilateral (DVT)  Result Date: 05/26/2021 CLINICAL DATA:  Bilateral lower extremity pain, right greater than left. History of skin cancer. Evaluate for DVT. EXAM: BILATERAL LOWER EXTREMITY VENOUS DOPPLER ULTRASOUND TECHNIQUE: Gray-scale sonography with graded compression, as well as color Doppler and duplex ultrasound were performed to evaluate the lower extremity deep venous systems from the level of the common femoral vein and including the common femoral, femoral, profunda femoral, popliteal and calf veins including the posterior tibial, peroneal and gastrocnemius veins when visible. The superficial great saphenous vein was also interrogated. Spectral Doppler was utilized to evaluate flow at rest and with distal augmentation maneuvers in the common femoral, femoral and popliteal veins. COMPARISON:  None. FINDINGS: RIGHT LOWER EXTREMITY Common Femoral Vein: No evidence of thrombus. Normal compressibility, respiratory phasicity and response to augmentation. Saphenofemoral Junction: No evidence of thrombus. Normal compressibility and flow on color Doppler imaging. Profunda Femoral Vein: No evidence of thrombus. Normal compressibility and flow on color Doppler imaging. Femoral Vein: No evidence of thrombus. Normal compressibility, respiratory phasicity and  response to augmentation. Popliteal Vein: No evidence of thrombus. Normal compressibility, respiratory phasicity and response to augmentation. Calf Veins: No evidence of thrombus. Normal compressibility and flow on color Doppler imaging. Superficial Great Saphenous Vein: No evidence of thrombus. Normal compressibility. Venous Reflux:  None. Other Findings:  None. LEFT LOWER EXTREMITY Common Femoral Vein: No evidence of thrombus. Normal compressibility, respiratory phasicity and response to augmentation. Saphenofemoral Junction: No evidence of thrombus. Normal compressibility and flow on color Doppler imaging. Profunda Femoral Vein: No evidence of thrombus. Normal compressibility and flow on color Doppler imaging. Femoral Vein: No evidence of thrombus. Normal compressibility, respiratory phasicity and response to augmentation. Popliteal Vein: No evidence of thrombus. Normal compressibility, respiratory phasicity and response to augmentation. Calf Veins: No evidence of thrombus. Normal compressibility and flow on color Doppler imaging. Superficial Great Saphenous Vein: No evidence of thrombus. Normal compressibility. Venous Reflux:  None. Other Findings:  None. IMPRESSION: No evidence of DVT within either lower extremity. Electronically Signed   By: Sandi Mariscal M.D.   On: 05/26/2021 14:47   DG Chest Port 1 View  Result Date: 05/26/2021 CLINICAL DATA:  Cough. EXAM: PORTABLE CHEST 1 VIEW COMPARISON:  Chest x-ray 02/10/2021. FINDINGS: Mediastinum hilar structures normal. Heart size normal. Low lung volumes with right base atelectasis/infiltrate. No pleural effusion or pneumothorax. IMPRESSION: Low lung volumes with right base atelectasis/infiltrate. Electronically Signed   By: Marcello Moores  Register   On: 05/26/2021 06:31    Blood pressure (!) 143/72, pulse 77, temperature 98.7 F (37.1 C), resp. rate 20, height '5\' 4"'  (1.626 m), weight 113.4 kg, SpO2 97 %.  Mental status: Alert and Oriented x 4 Nontender pulsatile  temporal arteries bilat. Visual Acuity:  20/70 OD  20/70 near cc  Pupils:  Equally round/ reactive to light.  No Afferent defect.  Motility:  Full/ orthophoric  Visual Fields:  Full to confrontation  IOP:  nml to palpation  External/ Lids/ Lashes:  Normal  Anterior Segment:  Conjunctiva:  Normal  OU  Cornea:  Normal  OU  Anterior Chamber: Normal  OU  Lens:   Normal OU  Posterior Segment: Dilated OU with 1% Tropicamide and 2.5% Phenylephrine  Discs:   Normal c/d ratio, no pallor, no edema OU  Macula:  Normal  Vessels/ Periphery: Normal    Assessment/Plan: Blurred vision  Given the elevated ESR and CRP (86 and 23), there is concern  for temporal arteritis.  However, given the hospital course of sepsis, one would expect some elevation of these two.  Additionally, thrombocytosis, not thrombocytopenia, would be more common with GCA.  Her headaches have only been present for approx 1 week with her illness. She has had no transient episodes of vision loss.  Her optic nerve and retinal exam appear normal. Temporal arteries are nontender.  She denies any scalp tenderness except for a scar on her forehead from recent excision.     There are other plausible explanations for bilateral blurred vision.  She may have had elevated blood glucose while on recent steroids, and is experiencing a hyperopic refractive shift resulting in the blurred near vision.  This would be the obvious explanation in a diabetic patient, but she may be experiencing a similar episode.  Additionally, recent steroid use can result in central serous retinopathy, which would not be evident on a bedside exam.    In summary, I think that the nature of the blurred vision does not fit with typical presentation of GCA.  A reasonable course would be to repeat labs and follow, rather than start steroids and submit the patient to a TA biopsy at this time.  Obviously the highly elevated ESR and CRP are a concern and will need to be  followed. I would expect gradual improvement and return to baseline of the labs.  If persistent elevation, then consider GCA more likely and obtain TA biopsy.   I can see her next week at [facility] and obtain another set of labs at that time.  I advised her that if she experiences any additional vision changes, especially a VF defect or monocular loss, to let us know immediately.   Johnavon Mcclafferty 05/26/2021, 6:32 PM

## 2021-05-26 NOTE — Progress Notes (Signed)
Patient ID: Jasmine Franco, female   DOB: [birth year], 55 y.o.   MRN: [id number] [facility] PROGRESS NOTE  VENA BASSINGER TGG:[id number] DOB: 1966/03/01 DOA: 05/22/2021 PCP: Gladstone Lighter, MD  HPI/Subjective: Patient last night started having sweating episode and started coughing.  She also states that she has had some blurred vision both eyes.  When she wears her reading glasses its blurry left close but can see better faraway with the reading glasses.  Does not make a difference if she covers 1 eye or the other eye.  Does have some pain on the right side of the head and headache.  Does have some pain in the right thigh and hip.  Prior to coming into the hospital was on steroids for her right shoulder and difficulty moving her shoulder around.  She was unable to sleep because of that.  Objective: Vitals:   05/26/21 1239 05/26/21 1307  BP: (!) 156/99   Pulse: 82   Resp: 18   Temp: 98.2 F (36.8 C)   SpO2: 97% 97%    Intake/Output Summary (Last 24 hours) at 05/26/2021 1458 Last data filed at 05/26/2021 1424 Gross per 24 hour  Intake 311.91 ml  Output --  Net 311.91 ml   Filed Weights   05/21/21 2139  Weight: 113.4 kg    ROS: Review of Systems  Respiratory: Positive for cough and shortness of breath.   Cardiovascular: Negative for chest pain.  Gastrointestinal: Negative for abdominal pain, nausea and vomiting.  Neurological: Positive for headaches.   Exam: Physical Exam HENT:     Head: Normocephalic.     Mouth/Throat:     Pharynx: No oropharyngeal exudate.  Eyes:     General: Lids are normal.     Extraocular Movements: Extraocular movements intact.     Conjunctiva/sclera: Conjunctivae normal.     Pupils: Pupils are equal, round, and reactive to light.     Comments: Vision with reading glasses 27 the left eye, 2090 right eye.  Cardiovascular:     Rate and Rhythm: Normal rate and regular rhythm.     Heart sounds: Normal heart sounds, S1 normal and S2 normal.   Pulmonary:     Breath sounds: Examination of the right-lower field reveals decreased breath sounds. Decreased breath sounds present. No wheezing, rhonchi or rales.  Abdominal:     Palpations: Abdomen is soft.     Tenderness: There is no abdominal tenderness.  Musculoskeletal:     Right hip: Tenderness present.     Right lower leg: Swelling present.     Left lower leg: Swelling present.     Comments: Slight tenderness over the right hip.  No sacroiliac pain  Skin:    General: Skin is warm.     Findings: No rash.  Neurological:     Mental Status: She is alert and oriented to person, place, and time.       Data Reviewed: Basic Metabolic Panel: Recent Labs  Lab 05/21/21 2144 05/23/21 0651 05/24/21 0539 05/26/21 0634  NA 137 135 133* 137  K 4.0 4.0 3.8 3.1*  CL 99 101 102 102  CO2 24 24 25 26   GLUCOSE 134* 108* 120* 116*  BUN 19 19 15 8   CREATININE 0.92 1.07* 0.70 0.55  CALCIUM 8.7* 8.1* 7.9* 7.7*   Liver Function Tests: Recent Labs  Lab 05/21/21 2144  AST 23  ALT 17  ALKPHOS 109  BILITOT 1.0  PROT 7.3  ALBUMIN 3.7   CBC: Recent Labs  Lab  05/21/21 2144 05/23/21 1914 05/24/21 0539 05/25/21 0433 05/26/21 0634  WBC 13.2* 14.0* 9.6 7.3 6.2  NEUTROABS  --   --   --  6.3  --   HGB 14.4 12.3 11.2* 10.4* 10.3*  HCT 42.8 36.9 33.2* 30.3* 30.8*  MCV 87.2 88.5 88.3 87.3 87.7  PLT 209 137* 115* 111* 145*    CBG: Recent Labs  Lab 05/22/21 2148  GLUCAP 126*    Recent Results (from the past 240 hour(s))  Urine culture     Status: Abnormal   Collection Time: 05/21/21  9:44 PM   Specimen: Urine, Random  Result Value Ref Range Status   Specimen Description   Final    URINE, RANDOM Performed at [facility], [address] [place], [place] [address]    Special Requests   Final    NONE Performed at [facility], [address] [place], [place] [address]    Culture >=100,000 COLONIES/mL KLEBSIELLA PNEUMONIAE (A)  Final   Report Status  05/24/2021 FINAL  Final   Organism ID, Bacteria KLEBSIELLA PNEUMONIAE (A)  Final      Susceptibility   Klebsiella pneumoniae - MIC*    AMPICILLIN RESISTANT Resistant     CEFAZOLIN <=4 SENSITIVE Sensitive     CEFEPIME <=0.12 SENSITIVE Sensitive     CEFTRIAXONE <=0.25 SENSITIVE Sensitive     CIPROFLOXACIN <=0.25 SENSITIVE Sensitive     GENTAMICIN <=1 SENSITIVE Sensitive     IMIPENEM <=0.25 SENSITIVE Sensitive     NITROFURANTOIN 32 SENSITIVE Sensitive     TRIMETH/SULFA <=20 SENSITIVE Sensitive     AMPICILLIN/SULBACTAM 4 SENSITIVE Sensitive     PIP/TAZO <=4 SENSITIVE Sensitive     * >=100,000 COLONIES/mL KLEBSIELLA PNEUMONIAE  Resp Panel by RT-PCR (Flu A&B, Covid) Nasopharyngeal Swab     Status: None   Collection Time: 05/22/21  1:41 AM   Specimen: Nasopharyngeal Swab; Nasopharyngeal(NP) swabs in vial transport medium  Result Value Ref Range Status   SARS Coronavirus 2 by RT PCR NEGATIVE NEGATIVE Final    Comment: (NOTE) SARS-CoV-2 target nucleic acids are NOT DETECTED.  The SARS-CoV-2 RNA is generally detectable in upper respiratory specimens during the acute phase of infection. The lowest concentration of SARS-CoV-2 viral copies this assay can detect is 138 copies/mL. A negative result does not preclude SARS-Cov-2 infection and should not be used as the sole basis for treatment or other patient management decisions. A negative result may occur with  improper specimen collection/handling, submission of specimen other than nasopharyngeal swab, presence of viral mutation(s) within the areas targeted by this assay, and inadequate number of viral copies(<138 copies/mL). A negative result must be combined with clinical observations, patient history, and epidemiological information. The expected result is Negative.  Fact Sheet for Patients:  EntrepreneurPulse.com.au  Fact Sheet for Healthcare Providers:  IncredibleEmployment.be  This test is no t  yet approved or cleared by the [place] FDA and  has been authorized for detection and/or diagnosis of SARS-CoV-2 by FDA under an Emergency Use Authorization (EUA). This EUA will remain  in effect (meaning this test can be used) for the duration of the COVID-19 declaration under Section 564(b)(1) of the Act, 21 U.S.C.section 360bbb-3(b)(1), unless the authorization is terminated  or revoked sooner.       Influenza A by PCR NEGATIVE NEGATIVE Final   Influenza B by PCR NEGATIVE NEGATIVE Final    Comment: (NOTE) The Xpert Xpress SARS-CoV-2/FLU/RSV plus assay is intended as an aid in the diagnosis of influenza  from Nasopharyngeal swab specimens and should not be used as a sole basis for treatment. Nasal washings and aspirates are unacceptable for Xpert Xpress SARS-CoV-2/FLU/RSV testing.  Fact Sheet for Patients: EntrepreneurPulse.com.au  Fact Sheet for Healthcare Providers: IncredibleEmployment.be  This test is not yet approved or cleared by the [place] FDA and has been authorized for detection and/or diagnosis of SARS-CoV-2 by FDA under an Emergency Use Authorization (EUA). This EUA will remain in effect (meaning this test can be used) for the duration of the COVID-19 declaration under Section 564(b)(1) of the Act, 21 U.S.C. section 360bbb-3(b)(1), unless the authorization is terminated or revoked.  Performed at [facility], [address] [place], [place] [address]   Blood culture (routine x 2)     Status: None (Preliminary result)   Collection Time: 05/22/21  5:26 AM   Specimen: BLOOD  Result Value Ref Range Status   Specimen Description BLOOD RIGHT ANTECUBITAL  Final   Special Requests   Final    BOTTLES DRAWN AEROBIC AND ANAEROBIC Blood Culture adequate volume   Culture   Final    NO GROWTH 4 DAYS Performed at [facility], [address], [place], [place] [address]    Report Status PENDING  Incomplete   Blood culture (routine x 2)     Status: None (Preliminary result)   Collection Time: 05/22/21  7:57 AM   Specimen: BLOOD RIGHT HAND  Result Value Ref Range Status   Specimen Description BLOOD RIGHT HAND  Final   Special Requests   Final    BOTTLES DRAWN AEROBIC AND ANAEROBIC Blood Culture adequate volume   Culture   Final    NO GROWTH 4 DAYS Performed at [facility], [address], [place], [place] [address]    Report Status PENDING  Incomplete     Studies: CT HEAD WO CONTRAST  Result Date: 05/26/2021 CLINICAL DATA:  Headache, new or worsening. EXAM: CT HEAD WITHOUT CONTRAST TECHNIQUE: Contiguous axial images were obtained from the base of the skull through the vertex without intravenous contrast. COMPARISON:  February 24, 2021. FINDINGS: Brain: No evidence of acute infarction, hemorrhage, hydrocephalus, extra-axial collection or mass lesion/mass effect. Vascular: No hyperdense vessel. Skull: No acute fracture. Sinuses/Orbits: Clear visualized sinuses.  Unremarkable orbits. Other: No mastoid effusions. IMPRESSION: No evidence of acute intracranial abnormality. Electronically Signed   By: Margaretha Sheffield MD   On: 05/26/2021 10:42   US Venous Img Lower Bilateral (DVT)  Result Date: 05/26/2021 CLINICAL DATA:  Bilateral lower extremity pain, right greater than left. History of skin cancer. Evaluate for DVT. EXAM: BILATERAL LOWER EXTREMITY VENOUS DOPPLER ULTRASOUND TECHNIQUE: Gray-scale sonography with graded compression, as well as color Doppler and duplex ultrasound were performed to evaluate the lower extremity deep venous systems from the level of the common femoral vein and including the common femoral, femoral, profunda femoral, popliteal and calf veins including the posterior tibial, peroneal and gastrocnemius veins when visible. The superficial great saphenous vein was also interrogated. Spectral Doppler was utilized to evaluate flow at rest and with distal augmentation maneuvers  in the common femoral, femoral and popliteal veins. COMPARISON:  None. FINDINGS: RIGHT LOWER EXTREMITY Common Femoral Vein: No evidence of thrombus. Normal compressibility, respiratory phasicity and response to augmentation. Saphenofemoral Junction: No evidence of thrombus. Normal compressibility and flow on color Doppler imaging. Profunda Femoral Vein: No evidence of thrombus. Normal compressibility and flow on color Doppler imaging. Femoral Vein: No evidence of thrombus. Normal compressibility, respiratory phasicity and response to augmentation.  Popliteal Vein: No evidence of thrombus. Normal compressibility, respiratory phasicity and response to augmentation. Calf Veins: No evidence of thrombus. Normal compressibility and flow on color Doppler imaging. Superficial Great Saphenous Vein: No evidence of thrombus. Normal compressibility. Venous Reflux:  None. Other Findings:  None. LEFT LOWER EXTREMITY Common Femoral Vein: No evidence of thrombus. Normal compressibility, respiratory phasicity and response to augmentation. Saphenofemoral Junction: No evidence of thrombus. Normal compressibility and flow on color Doppler imaging. Profunda Femoral Vein: No evidence of thrombus. Normal compressibility and flow on color Doppler imaging. Femoral Vein: No evidence of thrombus. Normal compressibility, respiratory phasicity and response to augmentation. Popliteal Vein: No evidence of thrombus. Normal compressibility, respiratory phasicity and response to augmentation. Calf Veins: No evidence of thrombus. Normal compressibility and flow on color Doppler imaging. Superficial Great Saphenous Vein: No evidence of thrombus. Normal compressibility. Venous Reflux:  None. Other Findings:  None. IMPRESSION: No evidence of DVT within either lower extremity. Electronically Signed   By: Sandi Mariscal M.D.   On: 05/26/2021 14:47   DG Chest Port 1 View  Result Date: 05/26/2021 CLINICAL DATA:  Cough. EXAM: PORTABLE CHEST 1 VIEW  COMPARISON:  Chest x-ray 02/10/2021. FINDINGS: Mediastinum hilar structures normal. Heart size normal. Low lung volumes with right base atelectasis/infiltrate. No pleural effusion or pneumothorax. IMPRESSION: Low lung volumes with right base atelectasis/infiltrate. Electronically Signed   By: Marcello Moores  Register   On: 05/26/2021 06:31    Scheduled Meds: . amitriptyline  25 mg Oral QHS  . [START ON 05/27/2021] azithromycin  250 mg Oral Daily  . enoxaparin (LOVENOX) injection  40 mg Subcutaneous Q24H  . ibuprofen  600 mg Oral Daily  . ipratropium-albuterol  3 mL Nebulization Q6H  . oxybutynin  10 mg Oral QHS   Continuous Infusions: . cefTRIAXone (ROCEPHIN)  IV 2 g (05/26/21 0143)   Brief history.  Patient admitted 05/22/2021 with clinical sepsis and acute right-sided pyelonephritis with leukocytosis tachycardia and lactic acidosis.  Patient was being given Rocephin.  She has been spiking fevers during the hospital course.  Temperature of 101.5 on 05/25/2021.  Today on 05/26/2021 complaining of visual disturbance.  She states that her eyes with the reading glasses are more blurry but is able to see further with her reading glasses on.  No pain in the eye.  No difference with covering each eye still have the blurry vision.  Assessment/Plan:  1. Severe sepsis, present on admission with right pyelonephritis.  Patient has been spiking fevers during the hospital course.  Fever of 101.5 on 05/25/2021.  Continue Rocephin.  Klebsiella growing out of the urine culture.  Thrombocytopenia starting to improve.  No signs of DIC.  So far today no temperatures over 101.  With her sweating episode last night likely improving from the sepsis standpoint. 2. Visual disturbance both eyes, headache.  Case discussed with Dr. Wallace Going ophthalmology to see the patient this evening after clinic.  Sedimentation rate 86 and CRP 23.2.  Both of these can be elevated with sepsis.  I did mention the possibility of starting steroids in  case this is temporal arteritis but patient and daughter would like to hold off on steroids until the eye doctor evaluates.  We will trend CRP and sedimentation rate tomorrow morning.  CT scan of the head negative. 3. Thrombocytopenia secondary to sepsis.  Platelet count up to 145 today.  Was as low as 111. 4. Lactic acidosis secondary to sepsis. 5. Cough.  X-ray last night showing atelectasis versus pneumonia.  Added Zithromax.  Cough syrup Tessalon Perles and nebulizer treatments. 6. Depression on amitriptyline 7. Overactive bladder on oxybutynin 8. Morbid obesity with a BMI of 42.91 9. Right hip pain and pain in the right thigh.  Could be a right hip bursitis and lateral cutaneous nerve compression.  Ultrasound of the lower extremities negative for DVT. 10. Elevated D-dimer.  Ultrasound the lower extremity negative for DVT 11. Hypokalemia.  Replace potassium        Code Status:     Code Status Orders  (From admission, onward)         Start     Ordered   05/22/21 0806  Full code  Continuous        05/22/21 0806        Code Status History    Date Active Date Inactive Code Status Order ID Comments User Context   02/10/2021 1141 02/12/2021 1850 Full Code [id number]  Tylene Fantasia, PA-C Inpatient   Advance Care Planning Activity     Family Communication: Spoke with daughter at the bedside Disposition Plan: Status is: Inpatient  Dispo: The patient is from: Home              Anticipated d/c is to: Home              Patient currently with visual disturbance today waiting to see ophthalmology.   Difficult to place patient.  No.  Consultants:  Ophthalmology this evening  Antibiotics:  Rocephin  Zithromax  Time spent: 28 minutes  [place]

## 2021-05-27 ENCOUNTER — Ambulatory Visit: Payer: 59

## 2021-05-27 DIAGNOSIS — A419 Sepsis, unspecified organism: Secondary | ICD-10-CM | POA: Diagnosis not present

## 2021-05-27 DIAGNOSIS — R6520 Severe sepsis without septic shock: Secondary | ICD-10-CM | POA: Diagnosis not present

## 2021-05-27 LAB — CBC WITH DIFFERENTIAL/PLATELET
Abs Immature Granulocytes: 0.04 10*3/uL (ref 0.00–0.07)
Basophils Absolute: 0 10*3/uL (ref 0.0–0.1)
Basophils Relative: 0 %
Eosinophils Absolute: 0.1 10*3/uL (ref 0.0–0.5)
Eosinophils Relative: 2 %
HCT: 31.2 % — ABNORMAL LOW (ref 36.0–46.0)
Hemoglobin: 10.4 g/dL — ABNORMAL LOW (ref 12.0–15.0)
Immature Granulocytes: 1 %
Lymphocytes Relative: 9 %
Lymphs Abs: 0.6 10*3/uL — ABNORMAL LOW (ref 0.7–4.0)
MCH: 29.2 pg (ref 26.0–34.0)
MCHC: 33.3 g/dL (ref 30.0–36.0)
MCV: 87.6 fL (ref 80.0–100.0)
Monocytes Absolute: 0.5 10*3/uL (ref 0.1–1.0)
Monocytes Relative: 8 %
Neutro Abs: 5.3 10*3/uL (ref 1.7–7.7)
Neutrophils Relative %: 80 %
Platelets: 176 10*3/uL (ref 150–400)
RBC: 3.56 MIL/uL — ABNORMAL LOW (ref 3.87–5.11)
RDW: 14.6 % (ref 11.5–15.5)
WBC: 6.6 10*3/uL (ref 4.0–10.5)
nRBC: 0 % (ref 0.0–0.2)

## 2021-05-27 LAB — CULTURE, BLOOD (ROUTINE X 2)
Culture: NO GROWTH
Culture: NO GROWTH
Special Requests: ADEQUATE
Special Requests: ADEQUATE

## 2021-05-27 LAB — BASIC METABOLIC PANEL
Anion gap: 7 (ref 5–15)
BUN: 8 mg/dL (ref 6–20)
CO2: 25 mmol/L (ref 22–32)
Calcium: 7.8 mg/dL — ABNORMAL LOW (ref 8.9–10.3)
Chloride: 105 mmol/L (ref 98–111)
Creatinine, Ser: 0.73 mg/dL (ref 0.44–1.00)
GFR, Estimated: >60 mL/min (ref >60)
Glucose, Bld: 115 mg/dL — ABNORMAL HIGH (ref 70–99)
Potassium: 4.1 mmol/L (ref 3.5–5.1)
Sodium: 137 mmol/L (ref 135–145)

## 2021-05-27 LAB — SEDIMENTATION RATE: Sed Rate: 89 mm/hr — ABNORMAL HIGH (ref 0–30)

## 2021-05-27 LAB — C-REACTIVE PROTEIN: CRP: 19.2 mg/dL — ABNORMAL HIGH (ref <1.0)

## 2021-05-27 MED ORDER — MILK OF MAGNESIA 7.75 % PO SUSP: 30.0000 mL | Freq: Every day | ORAL | 0 refills | Status: AC | PRN

## 2021-05-27 MED ORDER — SENNA 8.6 MG PO TABS: 1.0000 | ORAL_TABLET | Freq: Every day | ORAL | 0 refills | Status: DC

## 2021-05-27 MED ORDER — BENZONATATE 200 MG PO CAPS: 200.0000 mg | ORAL_CAPSULE | Freq: Two times a day (BID) | ORAL | 0 refills | Status: DC | PRN

## 2021-05-27 MED ORDER — CIPROFLOXACIN HCL 500 MG PO TABS: 500.0000 mg | ORAL_TABLET | Freq: Two times a day (BID) | ORAL | 0 refills | Status: AC

## 2021-05-27 NOTE — Discharge Instructions (Signed)
Pyelonephritis, Adult Pyelonephritis is an infection that occurs in the kidney. The kidneys are the organs that filter a person's blood and move waste out of the bloodstream and into the urine. Urine passes from the kidneys, through tubes called ureters, and into the bladder. There are two main types of pyelonephritis:  Infections that come on quickly without any warning (acute pyelonephritis).  Infections that last for a long period of time (chronic pyelonephritis). In most cases, the infection clears up with treatment and does not cause further problems. More severe infections or chronic infections can sometimes spread to the bloodstream or lead to other problems with the kidneys. What are the causes? This condition is usually caused by:  Bacteria traveling from the bladder up to the kidney. This may occur after having a bladder infection (cystitis) or urinary tract infection (UTI).  Bladder infections caused from bacteria traveling from the bloodstream to the kidney. What increases the risk? This condition is more likely to develop in:  Pregnant women.  Older people.  People who have any of these conditions: ? Diabetes. ? Inflammation of the prostate gland (prostatitis), in males. ? Kidney stones or bladder stones. ? Other abnormalities of the kidney or ureter. ? Cancer.  People who have a catheter placed in the bladder.  People who are sexually active.  Women who use spermicides.  People who have had a prior UTI. What are the signs or symptoms? Symptoms of this condition include:  Frequent urination.  Strong or persistent urge to urinate.  Burning or stinging when urinating.  Abdominal pain.  Back pain.  Pain in the side or flank area.  Fever or chills.  Blood in the urine, or dark urine.  Nausea or vomiting. How is this diagnosed? This condition may be diagnosed based on:  Your medical history and a physical exam.  Urine tests.  Blood tests. You may  also have imaging tests of the kidneys, such as an ultrasound or CT scan. How is this treated? Treatment for this condition may depend on the severity of the infection.  If the infection is mild and is found early, you may be treated with antibiotic medicines taken by mouth (orally). You will need to drink fluids to remain hydrated.  If the infection is more severe, you may need to stay in the hospital and receive antibiotics given directly into a vein through an IV. You may also need to receive fluids through an IV if you are not able to remain hydrated. After your hospital stay, you may need to take oral antibiotics for a period of time. Other treatments may be required, depending on the cause of the infection. Follow these instructions at home: Medicines  Take your antibiotic medicine as told by your health care provider. Do not stop taking the antibiotic even if you start to feel better.  Take over-the-counter and prescription medicines only as told by your health care provider. General instructions  Drink enough fluid to keep your urine pale yellow.  Avoid caffeine, tea, and carbonated beverages. They tend to irritate the bladder.  Urinate often. Avoid holding in urine for long periods of time.  Urinate before and after sex.  After a bowel movement, women should cleanse from front to back. Use each tissue only once.  Keep all follow-up visits as told by your health care provider. This is important.   Contact a health care provider if:  Your symptoms do not get better after 2 days of treatment.  Your symptoms get  worse.  You have a fever. Get help right away if you:  Are unable to take your antibiotics or fluids.  Have shaking chills.  Vomit.  Have severe flank or back pain.  Have extreme weakness or fainting. Summary  Pyelonephritis is a urinary tract infection (UTI) that occurs in the kidney.  Treatment for this condition may depend on the severity of the  infection.  Take your antibiotic medicine as told by your health care provider. Do not stop taking the antibiotic even if you start to feel better.  Drink enough fluid to keep your urine pale yellow.  Keep all follow-up visits as told by your health care provider. This is important. This information is not intended to replace advice given to you by your health care provider. Make sure you discuss any questions you have with your health care provider. Document Revised: 10/17/2018 Document Reviewed: 10/17/2018 Elsevier Patient Education  2021 [employer].

## 2021-05-27 NOTE — Discharge Summary (Signed)
Physician Discharge Summary  Jasmine Franco KYH:[id number] DOB: 06-21-1966 DOA: 05/22/2021  PCP: Gladstone Lighter, MD  Admit date: 05/22/2021 Discharge date: 05/27/2021  Admitted From: Home Disposition: Home  Recommendations for Outpatient Follow-up:  1. Follow up with PCP in 1-2 weeks 2. Follow-up with ophthalmology 1 to 2 weeks  Home Health: No Equipment/Devices: None Discharge Condition: Stable CODE STATUS: Full Diet recommendation: Heart Healthy Brief/Interim Summary: Patient admitted 05/22/2021 with clinical sepsis and acute right-sided pyelonephritis with leukocytosis tachycardia and lactic acidosis.  Patient was being given Rocephin.  She has been spiking fevers during the hospital course.  Temperature of 101.5 on 05/25/2021.  Today on 05/26/2021 complaining of visual disturbance.  She states that her eyes with the reading glasses are more blurry but is able to see further with her reading glasses on.  No pain in the eye.  No difference with covering each eye still have the blurry vision.  Seen by ophthalmology given concern for temporal arteritis in the setting of elevated inflammatory markers.  Presentation not entirely consistent with temporal arteritis.  Recommended outpatient follow-up in 1 week post discharge and sooner should the patient develop unilateral vision loss.  On day of discharge patient is hemodynamically stable.  She has been afebrile for over 24 hours.  She is medically stable for discharge at this time.  Will discontinue cephalosporins given remote possibility of neurotoxicity resulting in ocular changes.  We will switch to ciprofloxacin 500 mg p.o. twice daily to complete total 10-day antibiotic course for complicated urinary tract infection.  Discharge Diagnoses:  Principal Problem:   Severe sepsis ([place]) Active Problems:   OAB (overactive bladder)   Pyelonephritis   Obesity, Class III, BMI 40-49.9 (morbid obesity) (HCC)   Klebsiella sepsis (HCC)   Lactic  acidosis   Thrombocytopenia (HCC)   Depression   AKI (acute kidney injury) ([place])   Visual disturbance   Cough  Right-sided pyelonephritis Severe sepsis secondary to above Patient responded to treatment with IV Rocephin.  Klebsiella growing from urine culture.  Pansensitive.  Switch to ciprofloxacin on day of discharge.  Follow-up PCP 1 week.  Visual changes bilaterally associate with headache Seen by ophthalmology in house given concern for temporal arteritis in the setting of elevated inflammatory markers.  These may be sepsis related.  No steroids recommended at this time.  Discharge home with outpatient follow-up with ophthalmology within 1 week.  Acute thrombocytopenia Secondary to sepsis.  Platelet count increasing once sepsis physiology improved  Discharge Instructions  Discharge Instructions    Diet - low sodium heart healthy   Complete by: As directed    Increase activity slowly   Complete by: As directed      Allergies as of 05/27/2021      Reactions   Cortisone Other (See Comments)   redness      Medication List    STOP taking these medications   gabapentin 100 MG capsule Commonly known as: NEURONTIN     TAKE these medications   amitriptyline 25 MG tablet Commonly known as: ELAVIL Take 25 mg by mouth at bedtime.   benzonatate 200 MG capsule Commonly known as: TESSALON Take 1 capsule (200 mg total) by mouth 2 (two) times daily as needed for cough.   ciprofloxacin 500 MG tablet Commonly known as: Cipro Take 1 tablet (500 mg total) by mouth 2 (two) times daily for 6 days.   cyclobenzaprine 10 MG tablet Commonly known as: FLEXERIL Take 10 mg by mouth at bedtime as needed for muscle  spasms.   desonide 0.05 % cream Commonly known as: DESOWEN Apply topically 2 (two) times daily.   etodolac 400 MG 24 hr tablet Commonly known as: LODINE XL Take 1 tablet (400 mg total) by mouth daily.   Milk of Magnesia 400 MG/5ML suspension Generic drug: magnesium  hydroxide Take 30 mLs by mouth daily as needed for up to 7 days for mild constipation.   oxybutynin 10 MG 24 hr tablet Commonly known as: DITROPAN-XL Take 10 mg by mouth daily.   senna 8.6 MG Tabs tablet Commonly known as: SENOKOT Take 1 tablet (8.6 mg total) by mouth daily for 7 days.       Follow-up Information    Gladstone Lighter, MD. Go on 06/03/2021.   Specialty: Internal Medicine Why: 10:30am appointment  Contact information: [place] [address] [phone]        Leandrew Koyanagi, MD. Schedule an appointment as soon as possible for a visit in 1 week.   Specialty: Ophthalmology Why: left a message for them to call pt Contact information: [place] [address] [phone]              Allergies  Allergen Reactions  . Cortisone Other (See Comments)    redness    Consultations:  Ophthalmology   Procedures/Studies: CT HEAD WO CONTRAST  Result Date: 05/26/2021 CLINICAL DATA:  Headache, new or worsening. EXAM: CT HEAD WITHOUT CONTRAST TECHNIQUE: Contiguous axial images were obtained from the base of the skull through the vertex without intravenous contrast. COMPARISON:  February 24, 2021. FINDINGS: Brain: No evidence of acute infarction, hemorrhage, hydrocephalus, extra-axial collection or mass lesion/mass effect. Vascular: No hyperdense vessel. Skull: No acute fracture. Sinuses/Orbits: Clear visualized sinuses.  Unremarkable orbits. Other: No mastoid effusions. IMPRESSION: No evidence of acute intracranial abnormality. Electronically Signed   By: Margaretha Sheffield MD   On: 05/26/2021 10:42   US Venous Img Lower Bilateral (DVT)  Result Date: 05/26/2021 CLINICAL DATA:  Bilateral lower extremity pain, right greater than left. History of skin cancer. Evaluate for DVT. EXAM: BILATERAL LOWER EXTREMITY VENOUS DOPPLER ULTRASOUND TECHNIQUE: Gray-scale sonography with graded compression, as well as color Doppler and  duplex ultrasound were performed to evaluate the lower extremity deep venous systems from the level of the common femoral vein and including the common femoral, femoral, profunda femoral, popliteal and calf veins including the posterior tibial, peroneal and gastrocnemius veins when visible. The superficial great saphenous vein was also interrogated. Spectral Doppler was utilized to evaluate flow at rest and with distal augmentation maneuvers in the common femoral, femoral and popliteal veins. COMPARISON:  None. FINDINGS: RIGHT LOWER EXTREMITY Common Femoral Vein: No evidence of thrombus. Normal compressibility, respiratory phasicity and response to augmentation. Saphenofemoral Junction: No evidence of thrombus. Normal compressibility and flow on color Doppler imaging. Profunda Femoral Vein: No evidence of thrombus. Normal compressibility and flow on color Doppler imaging. Femoral Vein: No evidence of thrombus. Normal compressibility, respiratory phasicity and response to augmentation. Popliteal Vein: No evidence of thrombus. Normal compressibility, respiratory phasicity and response to augmentation. Calf Veins: No evidence of thrombus. Normal compressibility and flow on color Doppler imaging. Superficial Great Saphenous Vein: No evidence of thrombus. Normal compressibility. Venous Reflux:  None. Other Findings:  None. LEFT LOWER EXTREMITY Common Femoral Vein: No evidence of thrombus. Normal compressibility, respiratory phasicity and response to augmentation. Saphenofemoral Junction: No evidence of thrombus. Normal compressibility and flow on color Doppler imaging. Profunda Femoral Vein: No evidence of thrombus. Normal compressibility  and flow on color Doppler imaging. Femoral Vein: No evidence of thrombus. Normal compressibility, respiratory phasicity and response to augmentation. Popliteal Vein: No evidence of thrombus. Normal compressibility, respiratory phasicity and response to augmentation. Calf Veins: No  evidence of thrombus. Normal compressibility and flow on color Doppler imaging. Superficial Great Saphenous Vein: No evidence of thrombus. Normal compressibility. Venous Reflux:  None. Other Findings:  None. IMPRESSION: No evidence of DVT within either lower extremity. Electronically Signed   By: Sandi Mariscal M.D.   On: 05/26/2021 14:47   DG Chest Port 1 View  Result Date: 05/26/2021 CLINICAL DATA:  Cough. EXAM: PORTABLE CHEST 1 VIEW COMPARISON:  Chest x-ray 02/10/2021. FINDINGS: Mediastinum hilar structures normal. Heart size normal. Low lung volumes with right base atelectasis/infiltrate. No pleural effusion or pneumothorax. IMPRESSION: Low lung volumes with right base atelectasis/infiltrate. Electronically Signed   By: Marcello Moores  Register   On: 05/26/2021 06:31   CT Renal Stone Study  Result Date: 05/22/2021 CLINICAL DATA:  Bilateral flank pain. EXAM: CT ABDOMEN AND PELVIS WITHOUT CONTRAST TECHNIQUE: Multidetector CT imaging of the abdomen and pelvis was performed following the standard protocol without IV contrast. COMPARISON:  CT abdomen pelvis 03/04/2021 FINDINGS: Lower chest: No acute abnormality. Hepatobiliary: No focal liver abnormality. Status post cholecystectomy. No biliary dilatation. Pancreas: No focal lesion. Normal pancreatic contour. No surrounding inflammatory changes. No main pancreatic ductal dilatation. Spleen: Normal in size without focal abnormality. Adrenals/Urinary Tract: No adrenal nodule bilaterally. Interval development of marked asymmetric right perinephric fat stranding. Redemonstration of a 5.5 x 5.2 cm fluid density lesion within the right kidney that likely represents a simple renal cyst. No nephrolithiasis, no hydronephrosis, and no contour-deforming renal mass. No ureterolithiasis or hydroureter. The urinary bladder is unremarkable. Stomach/Bowel: Stomach is within normal limits. No evidence of bowel wall thickening or dilatation. Diffuse sigmoid diverticulosis. The appendix is  normal in caliber. Appendicoliths noted within the appendix. Vascular/Lymphatic: No abdominal aorta or iliac aneurysm. Mild atherosclerotic plaque of the aorta and its branches. No abdominal, pelvic, or inguinal lymphadenopathy. Reproductive: Uterus and bilateral adnexa are unremarkable. Other: No intraperitoneal free fluid. No intraperitoneal free gas. No organized fluid collection. Musculoskeletal: No abdominal aorta or iliac aneurysm. Mild atherosclerotic plaque of the aorta and its branches. No abdominal, pelvic, or inguinal lymphadenopathy. IMPRESSION: 1. Interval development of marked asymmetric right perinephric fat stranding. Recently identified punctate right nephrolithiasis no longer visualized. Findings may be due to recently passed stone. Correlate with urinalysis for infection. 2. Diffuse sigmoid diverticulosis with no acute diverticulitis. 3.  Aortic Atherosclerosis (ICD10-I70.0). Electronically Signed   By: Iven Finn M.D.   On: 05/22/2021 02:26    (Echo, Carotid, EGD, Colonoscopy, ERCP)    Subjective: Seen and examined the day of discharge.  Stable, no distress.  Daughter at bedside.  Stable for discharge home  Discharge Exam: Vitals:   05/27/21 0809 05/27/21 0812  BP: 131/82   Pulse: 87   Resp: 18   Temp: 99.6 F (37.6 C)   SpO2: 97% 96%   Vitals:   05/27/21 0014 05/27/21 0445 05/27/21 0809 05/27/21 0812  BP: 120/69 (!) 129/59 131/82   Pulse: (!) 101 95 87   Resp: 18 18 18    Temp: 98.6 F (37 C) 98.3 F (36.8 C) 99.6 F (37.6 C)   TempSrc:  Oral Oral   SpO2: 96% 98% 97% 96%  Weight:      Height:        General: Pt is alert, awake, not in acute distress Cardiovascular: RRR,  S1/S2 +, no rubs, no gallops Respiratory: CTA bilaterally, no wheezing, no rhonchi Abdominal: Soft, NT, ND, bowel sounds + Extremities: no edema, no cyanosis    The results of significant diagnostics from this hospitalization (including imaging, microbiology, ancillary and laboratory)  are listed below for reference.     Microbiology: Recent Results (from the past 240 hour(s))  Urine culture     Status: Abnormal   Collection Time: 05/21/21  9:44 PM   Specimen: Urine, Random  Result Value Ref Range Status   Specimen Description   Final    URINE, RANDOM Performed at [facility], [address], [place], [place] [address]    Special Requests   Final    NONE Performed at [facility], [place], [place] [address]    Culture >=100,000 COLONIES/mL KLEBSIELLA PNEUMONIAE (A)  Final   Report Status 05/24/2021 FINAL  Final   Organism ID, Bacteria KLEBSIELLA PNEUMONIAE (A)  Final      Susceptibility   Klebsiella pneumoniae - MIC*    AMPICILLIN RESISTANT Resistant     CEFAZOLIN <=4 SENSITIVE Sensitive     CEFEPIME <=0.12 SENSITIVE Sensitive     CEFTRIAXONE <=0.25 SENSITIVE Sensitive     CIPROFLOXACIN <=0.25 SENSITIVE Sensitive     GENTAMICIN <=1 SENSITIVE Sensitive     IMIPENEM <=0.25 SENSITIVE Sensitive     NITROFURANTOIN 32 SENSITIVE Sensitive     TRIMETH/SULFA <=20 SENSITIVE Sensitive     AMPICILLIN/SULBACTAM 4 SENSITIVE Sensitive     PIP/TAZO <=4 SENSITIVE Sensitive     * >=100,000 COLONIES/mL KLEBSIELLA PNEUMONIAE  Resp Panel by RT-PCR (Flu A&B, Covid) Nasopharyngeal Swab     Status: None   Collection Time: 05/22/21  1:41 AM   Specimen: Nasopharyngeal Swab; Nasopharyngeal(NP) swabs in vial transport medium  Result Value Ref Range Status   SARS Coronavirus 2 by RT PCR NEGATIVE NEGATIVE Final    Comment: (NOTE) SARS-CoV-2 target nucleic acids are NOT DETECTED.  The SARS-CoV-2 RNA is generally detectable in upper respiratory specimens during the acute phase of infection. The lowest concentration of SARS-CoV-2 viral copies this assay can detect is 138 copies/mL. A negative result does not preclude SARS-Cov-2 infection and should not be used as the sole basis for treatment or other patient management decisions. A negative result  may occur with  improper specimen collection/handling, submission of specimen other than nasopharyngeal swab, presence of viral mutation(s) within the areas targeted by this assay, and inadequate number of viral copies(<138 copies/mL). A negative result must be combined with clinical observations, patient history, and epidemiological information. The expected result is Negative.  Fact Sheet for Patients:  EntrepreneurPulse.com.au  Fact Sheet for Healthcare Providers:  IncredibleEmployment.be  This test is no t yet approved or cleared by the [place] FDA and  has been authorized for detection and/or diagnosis of SARS-CoV-2 by FDA under an Emergency Use Authorization (EUA). This EUA will remain  in effect (meaning this test can be used) for the duration of the COVID-19 declaration under Section 564(b)(1) of the Act, 21 U.S.C.section 360bbb-3(b)(1), unless the authorization is terminated  or revoked sooner.       Influenza A by PCR NEGATIVE NEGATIVE Final   Influenza B by PCR NEGATIVE NEGATIVE Final    Comment: (NOTE) The Xpert Xpress SARS-CoV-2/FLU/RSV plus assay is intended as an aid in the diagnosis of influenza from Nasopharyngeal swab specimens and should not be used as a sole basis for treatment. Nasal washings and aspirates are unacceptable for Xpert Xpress  SARS-CoV-2/FLU/RSV testing.  Fact Sheet for Patients: EntrepreneurPulse.com.au  Fact Sheet for Healthcare Providers: IncredibleEmployment.be  This test is not yet approved or cleared by the [place] FDA and has been authorized for detection and/or diagnosis of SARS-CoV-2 by FDA under an Emergency Use Authorization (EUA). This EUA will remain in effect (meaning this test can be used) for the duration of the COVID-19 declaration under Section 564(b)(1) of the Act, 21 U.S.C. section 360bbb-3(b)(1), unless the authorization is terminated  or revoked.  Performed at [facility], [address] [place], [place] [address]   Blood culture (routine x 2)     Status: None   Collection Time: 05/22/21  5:26 AM   Specimen: BLOOD  Result Value Ref Range Status   Specimen Description BLOOD RIGHT ANTECUBITAL  Final   Special Requests   Final    BOTTLES DRAWN AEROBIC AND ANAEROBIC Blood Culture adequate volume   Culture   Final    NO GROWTH 5 DAYS Performed at [facility], [address], [place], [place] [address]    Report Status 05/27/2021 FINAL  Final  Blood culture (routine x 2)     Status: None   Collection Time: 05/22/21  7:57 AM   Specimen: BLOOD RIGHT HAND  Result Value Ref Range Status   Specimen Description BLOOD RIGHT HAND  Final   Special Requests   Final    BOTTLES DRAWN AEROBIC AND ANAEROBIC Blood Culture adequate volume   Culture   Final    NO GROWTH 5 DAYS Performed at [facility], [address] [place], [place] [address]    Report Status 05/27/2021 FINAL  Final     Labs: BNP (last 3 results) No results for input(s): BNP in the last 8760 hours. Basic Metabolic Panel: Recent Labs  Lab 05/21/21 2144 05/23/21 0651 05/24/21 0539 05/26/21 0634 05/27/21 0528  NA 137 135 133* 137 137  K 4.0 4.0 3.8 3.1* 4.1  CL 99 101 102 102 105  CO2 24 24 25 26 25   GLUCOSE 134* 108* 120* 116* 115*  BUN 19 19 15 8 8   CREATININE 0.92 1.07* 0.70 0.55 0.73  CALCIUM 8.7* 8.1* 7.9* 7.7* 7.8*   Liver Function Tests: Recent Labs  Lab 05/21/21 2144  AST 23  ALT 17  ALKPHOS 109  BILITOT 1.0  PROT 7.3  ALBUMIN 3.7   No results for input(s): LIPASE, AMYLASE in the last 168 hours. No results for input(s): AMMONIA in the last 168 hours. CBC: Recent Labs  Lab 05/23/21 0651 05/24/21 0539 05/25/21 0433 05/26/21 0634 05/27/21 0528  WBC 14.0* 9.6 7.3 6.2 6.6  NEUTROABS  --   --  6.3  --  5.3  HGB 12.3 11.2* 10.4* 10.3* 10.4*  HCT 36.9 33.2* 30.3* 30.8* 31.2*  MCV 88.5 88.3 87.3  87.7 87.6  PLT 137* 115* 111* 145* 176   Cardiac Enzymes: No results for input(s): CKTOTAL, CKMB, CKMBINDEX, TROPONINI in the last 168 hours. BNP: Invalid input(s): POCBNP CBG: Recent Labs  Lab 05/22/21 2148  GLUCAP 126*   D-Dimer Recent Labs    05/25/21 0433 05/26/21 0634  DDIMER 2.49* 3.10*   Hgb A1c No results for input(s): HGBA1C in the last 72 hours. Lipid Profile No results for input(s): CHOL, HDL, LDLCALC, TRIG, CHOLHDL, LDLDIRECT in the last 72 hours. Thyroid function studies No results for input(s): TSH, T4TOTAL, T3FREE, THYROIDAB in the last 72 hours.  Invalid input(s): FREET3 Anemia work up No results for input(s): VITAMINB12, FOLATE, FERRITIN,  TIBC, IRON, RETICCTPCT in the last 72 hours. Urinalysis    Component Value Date/Time   COLORURINE YELLOW (A) 05/21/2021 2144   APPEARANCEUR HAZY (A) 05/21/2021 2144   LABSPEC 1.011 05/21/2021 2144   PHURINE 8.0 05/21/2021 2144   GLUCOSEU NEGATIVE 05/21/2021 2144   HGBUR LARGE (A) 05/21/2021 2144   [place] NEGATIVE 05/21/2021 2144   [place] NEGATIVE 05/21/2021 2144   PROTEINUR 30 (A) 05/21/2021 2144   NITRITE NEGATIVE 05/21/2021 2144   LEUKOCYTESUR LARGE (A) 05/21/2021 2144   Sepsis Labs Invalid input(s): PROCALCITONIN,  WBC,  LACTICIDVEN Microbiology Recent Results (from the past 240 hour(s))  Urine culture     Status: Abnormal   Collection Time: 05/21/21  9:44 PM   Specimen: Urine, Random  Result Value Ref Range Status   Specimen Description   Final    URINE, RANDOM Performed at [facility], [address], [place], [place] [address]    Special Requests   Final    NONE Performed at [facility], [address], [place], [place] [address]    Culture >=100,000 COLONIES/mL KLEBSIELLA PNEUMONIAE (A)  Final   Report Status 05/24/2021 FINAL  Final   Organism ID, Bacteria KLEBSIELLA PNEUMONIAE (A)  Final      Susceptibility   Klebsiella pneumoniae - MIC*    AMPICILLIN RESISTANT  Resistant     CEFAZOLIN <=4 SENSITIVE Sensitive     CEFEPIME <=0.12 SENSITIVE Sensitive     CEFTRIAXONE <=0.25 SENSITIVE Sensitive     CIPROFLOXACIN <=0.25 SENSITIVE Sensitive     GENTAMICIN <=1 SENSITIVE Sensitive     IMIPENEM <=0.25 SENSITIVE Sensitive     NITROFURANTOIN 32 SENSITIVE Sensitive     TRIMETH/SULFA <=20 SENSITIVE Sensitive     AMPICILLIN/SULBACTAM 4 SENSITIVE Sensitive     PIP/TAZO <=4 SENSITIVE Sensitive     * >=100,000 COLONIES/mL KLEBSIELLA PNEUMONIAE  Resp Panel by RT-PCR (Flu A&B, Covid) Nasopharyngeal Swab     Status: None   Collection Time: 05/22/21  1:41 AM   Specimen: Nasopharyngeal Swab; Nasopharyngeal(NP) swabs in vial transport medium  Result Value Ref Range Status   SARS Coronavirus 2 by RT PCR NEGATIVE NEGATIVE Final    Comment: (NOTE) SARS-CoV-2 target nucleic acids are NOT DETECTED.  The SARS-CoV-2 RNA is generally detectable in upper respiratory specimens during the acute phase of infection. The lowest concentration of SARS-CoV-2 viral copies this assay can detect is 138 copies/mL. A negative result does not preclude SARS-Cov-2 infection and should not be used as the sole basis for treatment or other patient management decisions. A negative result may occur with  improper specimen collection/handling, submission of specimen other than nasopharyngeal swab, presence of viral mutation(s) within the areas targeted by this assay, and inadequate number of viral copies(<138 copies/mL). A negative result must be combined with clinical observations, patient history, and epidemiological information. The expected result is Negative.  Fact Sheet for Patients:  EntrepreneurPulse.com.au  Fact Sheet for Healthcare Providers:  IncredibleEmployment.be  This test is no t yet approved or cleared by the [place] FDA and  has been authorized for detection and/or diagnosis of SARS-CoV-2 by FDA under an Emergency Use  Authorization (EUA). This EUA will remain  in effect (meaning this test can be used) for the duration of the COVID-19 declaration under Section 564(b)(1) of the Act, 21 U.S.C.section 360bbb-3(b)(1), unless the authorization is terminated  or revoked sooner.       Influenza A by PCR NEGATIVE NEGATIVE Final   Influenza B by PCR NEGATIVE NEGATIVE  Final    Comment: (NOTE) The Xpert Xpress SARS-CoV-2/FLU/RSV plus assay is intended as an aid in the diagnosis of influenza from Nasopharyngeal swab specimens and should not be used as a sole basis for treatment. Nasal washings and aspirates are unacceptable for Xpert Xpress SARS-CoV-2/FLU/RSV testing.  Fact Sheet for Patients: EntrepreneurPulse.com.au  Fact Sheet for Healthcare Providers: IncredibleEmployment.be  This test is not yet approved or cleared by the [place] FDA and has been authorized for detection and/or diagnosis of SARS-CoV-2 by FDA under an Emergency Use Authorization (EUA). This EUA will remain in effect (meaning this test can be used) for the duration of the COVID-19 declaration under Section 564(b)(1) of the Act, 21 U.S.C. section 360bbb-3(b)(1), unless the authorization is terminated or revoked.  Performed at [facility], [address] [place], [place] [address]   Blood culture (routine x 2)     Status: None   Collection Time: 05/22/21  5:26 AM   Specimen: BLOOD  Result Value Ref Range Status   Specimen Description BLOOD RIGHT ANTECUBITAL  Final   Special Requests   Final    BOTTLES DRAWN AEROBIC AND ANAEROBIC Blood Culture adequate volume   Culture   Final    NO GROWTH 5 DAYS Performed at [facility], [address], [place], [place] [address]    Report Status 05/27/2021 FINAL  Final  Blood culture (routine x 2)     Status: None   Collection Time: 05/22/21  7:57 AM   Specimen: BLOOD RIGHT HAND  Result Value Ref Range Status   Specimen  Description BLOOD RIGHT HAND  Final   Special Requests   Final    BOTTLES DRAWN AEROBIC AND ANAEROBIC Blood Culture adequate volume   Culture   Final    NO GROWTH 5 DAYS Performed at [facility], [address] [place],  [address]    Report Status 05/27/2021 FINAL  Final     Time coordinating discharge: Over 30 minutes  SIGNED:   Sidney Ace, MD  [facility] 05/27/2021, 1:15 PM Pager   If 7PM-7AM, please contact night-coverage

## 2021-05-28 LAB — HEMOGLOBIN A1C
Hgb A1c MFr Bld: 5.7 % — ABNORMAL HIGH (ref 4.8–5.6)
Mean Plasma Glucose: 117 mg/dL

## 2021-06-01 ENCOUNTER — Ambulatory Visit
Admission: RE | Admit: 2021-06-01 | Discharge: 2021-06-01 | Disposition: A | Discharge status: Home / Self Care | Payer: 59 | Source: Ambulatory Visit | Attending: Internal Medicine | Admitting: Internal Medicine

## 2021-06-01 ENCOUNTER — Other Ambulatory Visit: Payer: Self-pay

## 2021-06-01 DIAGNOSIS — M25512 Pain in left shoulder: Secondary | ICD-10-CM

## 2021-06-01 DIAGNOSIS — G8929 Other chronic pain: Secondary | ICD-10-CM | POA: Insufficient documentation

## 2021-06-01 DIAGNOSIS — M75122 Complete rotator cuff tear or rupture of left shoulder, not specified as traumatic: Secondary | ICD-10-CM | POA: Diagnosis present

## 2021-06-03 ENCOUNTER — Other Ambulatory Visit: Payer: Self-pay | Admitting: Orthopedic Surgery

## 2021-06-03 DIAGNOSIS — M25512 Pain in left shoulder: Secondary | ICD-10-CM

## 2021-06-04 ENCOUNTER — Ambulatory Visit
Admission: RE | Admit: 2021-06-04 | Discharge: 2021-06-04 | Disposition: A | Discharge status: Home / Self Care | Payer: 59 | Source: Ambulatory Visit | Attending: Orthopedic Surgery | Admitting: Orthopedic Surgery

## 2021-06-04 ENCOUNTER — Other Ambulatory Visit: Payer: Self-pay

## 2021-06-04 ENCOUNTER — Other Ambulatory Visit
Admission: RE | Admit: 2021-06-04 | Discharge: 2021-06-04 | Disposition: A | Discharge status: Home / Self Care | Payer: 59 | Source: Home / Self Care | Attending: Ophthalmology | Admitting: Ophthalmology

## 2021-06-04 DIAGNOSIS — M25512 Pain in left shoulder: Secondary | ICD-10-CM | POA: Diagnosis present

## 2021-06-04 DIAGNOSIS — H538 Other visual disturbances: Secondary | ICD-10-CM | POA: Insufficient documentation

## 2021-06-04 DIAGNOSIS — R519 Headache, unspecified: Secondary | ICD-10-CM | POA: Insufficient documentation

## 2021-06-04 LAB — SEDIMENTATION RATE: Sed Rate: 58 mm/hr — ABNORMAL HIGH (ref 0–30)

## 2021-06-04 LAB — C-REACTIVE PROTEIN: CRP: 2.8 mg/dL — ABNORMAL HIGH (ref <1.0)

## 2021-06-09 ENCOUNTER — Other Ambulatory Visit: Payer: Self-pay

## 2021-06-09 ENCOUNTER — Other Ambulatory Visit
Admission: RE | Admit: 2021-06-09 | Discharge: 2021-06-09 | Disposition: A | Discharge status: Home / Self Care | Payer: 59 | Source: Ambulatory Visit | Attending: Orthopedic Surgery | Admitting: Orthopedic Surgery

## 2021-06-09 DIAGNOSIS — Z01818 Encounter for other preprocedural examination: Secondary | ICD-10-CM | POA: Diagnosis present

## 2021-06-09 HISTORY — DX: Dyspnea, unspecified: R06.00

## 2021-06-09 LAB — CBC WITH DIFFERENTIAL/PLATELET
Abs Immature Granulocytes: 0.03 10*3/uL (ref 0.00–0.07)
Basophils Absolute: 0.1 10*3/uL (ref 0.0–0.1)
Basophils Relative: 1 %
Eosinophils Absolute: 0.2 10*3/uL (ref 0.0–0.5)
Eosinophils Relative: 4 %
HCT: 40.7 % (ref 36.0–46.0)
Hemoglobin: 12.8 g/dL (ref 12.0–15.0)
Immature Granulocytes: 1 %
Lymphocytes Relative: 28 %
Lymphs Abs: 1.4 10*3/uL (ref 0.7–4.0)
MCH: 28.7 pg (ref 26.0–34.0)
MCHC: 31.4 g/dL (ref 30.0–36.0)
MCV: 91.3 fL (ref 80.0–100.0)
Monocytes Absolute: 0.3 10*3/uL (ref 0.1–1.0)
Monocytes Relative: 5 %
Neutro Abs: 3 10*3/uL (ref 1.7–7.7)
Neutrophils Relative %: 61 %
Platelets: 346 10*3/uL (ref 150–400)
RBC: 4.46 MIL/uL (ref 3.87–5.11)
RDW: 14.2 % (ref 11.5–15.5)
WBC: 4.9 10*3/uL (ref 4.0–10.5)
nRBC: 0 % (ref 0.0–0.2)

## 2021-06-09 LAB — COMPREHENSIVE METABOLIC PANEL
ALT: 16 U/L (ref 0–44)
AST: 17 U/L (ref 15–41)
Albumin: 3.4 g/dL — ABNORMAL LOW (ref 3.5–5.0)
Alkaline Phosphatase: 94 U/L (ref 38–126)
Anion gap: 6 (ref 5–15)
BUN: 22 mg/dL — ABNORMAL HIGH (ref 6–20)
CO2: 28 mmol/L (ref 22–32)
Calcium: 8.7 mg/dL — ABNORMAL LOW (ref 8.9–10.3)
Chloride: 105 mmol/L (ref 98–111)
Creatinine, Ser: 0.79 mg/dL (ref 0.44–1.00)
GFR, Estimated: >60 mL/min (ref >60)
Glucose, Bld: 101 mg/dL — ABNORMAL HIGH (ref 70–99)
Potassium: 4.1 mmol/L (ref 3.5–5.1)
Sodium: 139 mmol/L (ref 135–145)
Total Bilirubin: 0.5 mg/dL (ref 0.3–1.2)
Total Protein: 7.4 g/dL (ref 6.5–8.1)

## 2021-06-09 LAB — URINALYSIS, ROUTINE W REFLEX MICROSCOPIC
Glucose, UA: NEGATIVE mg/dL
Hgb urine dipstick: NEGATIVE
Ketones, ur: NEGATIVE mg/dL
Nitrite: NEGATIVE
Protein, ur: NEGATIVE mg/dL
Specific Gravity, Urine: 1.024 (ref 1.005–1.030)
pH: 5 (ref 5.0–8.0)

## 2021-06-09 LAB — TYPE AND SCREEN
ABO/RH(D): A NEG
Antibody Screen: NEGATIVE

## 2021-06-09 LAB — SURGICAL PCR SCREEN
MRSA, PCR: NEGATIVE
Staphylococcus aureus: NEGATIVE

## 2021-06-09 NOTE — Patient Instructions (Addendum)
Your procedure is scheduled on: 06/15/2021 Report to Day Surgery above medical mall on 2nd floor. Enter at the  [facility]. To find out your arrival time please call [phone] between 1PM - 3PM on June 17,2022  to get your surgery time  Remember: Instructions that are not followed completely may result in serious medical risk,  up to and including death, or upon the discretion of your surgeon and anesthesiologist your  surgery may need to be rescheduled.     _X__ 1. Do not eat food after midnight the night before your procedure.                 No chewing gum or hard candies. You may drink clear liquids up to 3 hours                 before you are scheduled to arrive for your surgery- DO not drink clear                 liquids within 3 hours of the start of your surgery.                 Clear Liquids include:  water, apple juice without pulp, clear Gatorade, G2 or                   ___X__2.   Complete the "Ensure Clear Pre-surgery Clear Carbohydrate Drink" provided to you, 3 hours before arrival.    __X__2.  On the morning of surgery brush your teeth with toothpaste and water, you                may rinse your mouth with mouthwash if you wish.  Do not swallow any toothpaste of mouthwash.     _X__ 3.  No Alcohol for 24 hours before or after surgery.   _X__ 4.  Do Not Smoke or use e-cigarettes For 24 Hours Prior to Your Surgery.                 Do not use any chewable tobacco products for at least 6 hours prior to                 Surgery.  _X__  5.  Do not use any recreational drugs (marijuana, cocaine, heroin, ecstasy, MDMA or other)                For at least one week prior to your surgery.  Combination of these drugs with anesthesia                May have life threatening results.  ____  6.  Bring all medications with you on the day of surgery if instructed.   __X_  7.  Notify your doctor if there is any change in your medical condition      (cold, fever, infections).      Do not wear jewelry, make-up, hairpins, clips or nail polish. Do not wear lotions, powders, or perfumes. You may wear deodorant. Do not shave 48 hours prior to surgery. Men may shave face and neck. Do not bring valuables to the hospital.    [facility] is not responsible for any belongings or valuables.  Contacts, dentures or bridgework may not be worn into surgery. Leave your suitcase in the car. After surgery it may be brought to your room. For patients admitted to the hospital, discharge time is determined by your treatment team.   Patients discharged the day of  surgery will not be allowed to drive home.   Make arrangements for someone to be with you for the first 24 hours of your Same Day Discharge.    Please read over the following fact sheets that you were given:     __X__ Take these medicines the morning of surgery with A SIP OF WATER:    1. omeprazole (PRILOSEC) 20 MG   2.   3.   4.  5.  6.  ____ Fleet Enema (as directed)   __X__ Use CHG Soap (or wipes) as directed  __X_ Use Benzoyl Peroxide Gel as instructed  ____ Use inhalers on the day of surgery    ____ Stop Coumadin/Plavix/aspirin on 7 days before surgery.  ____ Stop any Anti-inflammatories 7 days before surgery including etodolac               ibuprofen. naproxen. diclofenac. celecoxib. mefenamic acid. etoricoxib. indomethacin. high-dose aspirin (low-dose aspirin is not normally considered to be an NSAID)    ____ Stop supplements until after surgery.       If you have any questions regarding your pre-procedure instructions,  Please call Pre-admit Testing at [phone]

## 2021-06-11 ENCOUNTER — Other Ambulatory Visit
Admission: RE | Admit: 2021-06-11 | Discharge: 2021-06-11 | Disposition: A | Discharge status: Home / Self Care | Payer: 59 | Source: Ambulatory Visit | Attending: Orthopedic Surgery | Admitting: Orthopedic Surgery

## 2021-06-11 ENCOUNTER — Other Ambulatory Visit: Payer: Self-pay

## 2021-06-11 DIAGNOSIS — Z01812 Encounter for preprocedural laboratory examination: Secondary | ICD-10-CM | POA: Diagnosis present

## 2021-06-11 DIAGNOSIS — Z20822 Contact with and (suspected) exposure to covid-19: Secondary | ICD-10-CM | POA: Insufficient documentation

## 2021-06-11 LAB — URINE CULTURE

## 2021-06-11 LAB — SARS CORONAVIRUS 2 (TAT 6-24 HRS): SARS Coronavirus 2: NEGATIVE

## 2021-06-12 NOTE — Pre-Procedure Instructions (Signed)
Dr. Posey Pronto made contact with the nurse practitioner in preadmit testing asking [place] to reach out to patient again as she was unsure of her medications to take/stop prior to surgery. He also wanted [place] to inform her that her surgery date was rescheduled and that she would need to repeat her covid test.  I reviewed her medications and confirmed what she should be taking and when to stop some meds. Patient stated an understanding of this info and was okay with returning for another covid test next week.

## 2021-06-17 ENCOUNTER — Other Ambulatory Visit: Payer: 59

## 2021-06-18 ENCOUNTER — Other Ambulatory Visit: Admission: RE | Admit: 2021-06-18 | Payer: 59 | Source: Ambulatory Visit

## 2021-06-22 ENCOUNTER — Ambulatory Visit: Payer: 59 | Admitting: Urgent Care

## 2021-06-22 ENCOUNTER — Encounter: Payer: Self-pay | Admitting: Orthopedic Surgery

## 2021-06-22 ENCOUNTER — Ambulatory Visit: Payer: 59

## 2021-06-22 ENCOUNTER — Ambulatory Visit
Admission: RE | Admit: 2021-06-22 | Discharge: 2021-06-22 | Disposition: A | Discharge status: Home / Self Care | Payer: 59 | Attending: Orthopedic Surgery | Admitting: Orthopedic Surgery

## 2021-06-22 ENCOUNTER — Encounter
Admission: RE | Disposition: A | Discharge status: Home / Self Care | Payer: Self-pay | Source: Home / Self Care | Attending: Orthopedic Surgery

## 2021-06-22 ENCOUNTER — Other Ambulatory Visit: Payer: Self-pay

## 2021-06-22 DIAGNOSIS — Z888 Allergy status to other drugs, medicaments and biological substances status: Secondary | ICD-10-CM | POA: Insufficient documentation

## 2021-06-22 DIAGNOSIS — M19012 Primary osteoarthritis, left shoulder: Secondary | ICD-10-CM | POA: Diagnosis present

## 2021-06-22 DIAGNOSIS — Z96619 Presence of unspecified artificial shoulder joint: Secondary | ICD-10-CM

## 2021-06-22 DIAGNOSIS — Z79899 Other long term (current) drug therapy: Secondary | ICD-10-CM | POA: Insufficient documentation

## 2021-06-22 DIAGNOSIS — M25712 Osteophyte, left shoulder: Secondary | ICD-10-CM | POA: Insufficient documentation

## 2021-06-22 DIAGNOSIS — M25512 Pain in left shoulder: Secondary | ICD-10-CM

## 2021-06-22 DIAGNOSIS — M75102 Unspecified rotator cuff tear or rupture of left shoulder, not specified as traumatic: Secondary | ICD-10-CM | POA: Insufficient documentation

## 2021-06-22 HISTORY — PX: REVERSE SHOULDER ARTHROPLASTY: SHX5054

## 2021-06-22 LAB — ABO/RH: ABO/RH(D): A NEG

## 2021-06-22 SURGERY — ARTHROPLASTY, SHOULDER, TOTAL, REVERSE
Anesthesia: General | Site: Shoulder | Laterality: Left

## 2021-06-22 MED ORDER — NEOMYCIN-POLYMYXIN B GU 40-200000 IR SOLN
Status: DC | PRN
  Administered 2021-06-22: 4 mL

## 2021-06-22 MED ORDER — CHLORHEXIDINE GLUCONATE 0.12 % MT SOLN: 15.0000 mL | Freq: Once | OROMUCOSAL | Status: AC

## 2021-06-22 MED ORDER — FENTANYL CITRATE (PF) 100 MCG/2ML IJ SOLN: 25.0000 ug | INTRAMUSCULAR | Status: DC | PRN

## 2021-06-22 MED ORDER — KETOROLAC TROMETHAMINE 30 MG/ML IJ SOLN
INTRAMUSCULAR | Status: DC | PRN
  Administered 2021-06-22: 30 mg via INTRAVENOUS

## 2021-06-22 MED ORDER — CHLORHEXIDINE GLUCONATE 0.12 % MT SOLN
OROMUCOSAL | Status: AC
  Administered 2021-06-22: 15 mL via OROMUCOSAL
  Filled 2021-06-22: qty 15

## 2021-06-22 MED ORDER — SUCCINYLCHOLINE CHLORIDE 20 MG/ML IJ SOLN
INTRAMUSCULAR | Status: DC | PRN
  Administered 2021-06-22: 200 mg via INTRAVENOUS

## 2021-06-22 MED ORDER — GLYCOPYRROLATE 0.2 MG/ML IJ SOLN
INTRAMUSCULAR | Status: DC | PRN
  Administered 2021-06-22: .2 mg via INTRAVENOUS

## 2021-06-22 MED ORDER — ONDANSETRON 4 MG PO TBDP: 4.0000 mg | ORAL_TABLET | Freq: Three times a day (TID) | ORAL | 0 refills | Status: DC | PRN

## 2021-06-22 MED ORDER — MIDAZOLAM HCL 2 MG/2ML IJ SOLN
INTRAMUSCULAR | Status: AC
  Filled 2021-06-22: qty 2

## 2021-06-22 MED ORDER — ROCURONIUM BROMIDE 100 MG/10ML IV SOLN
INTRAVENOUS | Status: DC | PRN
  Administered 2021-06-22: 35 mg via INTRAVENOUS

## 2021-06-22 MED ORDER — NEOMYCIN-POLYMYXIN B GU 40-200000 IR SOLN
Status: AC
  Filled 2021-06-22: qty 20

## 2021-06-22 MED ORDER — PROPOFOL 10 MG/ML IV BOLUS
INTRAVENOUS | Status: DC | PRN
  Administered 2021-06-22: 150 mg via INTRAVENOUS

## 2021-06-22 MED ORDER — VANCOMYCIN HCL 1000 MG IV SOLR
INTRAVENOUS | Status: AC
  Filled 2021-06-22: qty 1000

## 2021-06-22 MED ORDER — TRANEXAMIC ACID-NACL 1000-0.7 MG/100ML-% IV SOLN
INTRAVENOUS | Status: AC
  Filled 2021-06-22: qty 100

## 2021-06-22 MED ORDER — TRANEXAMIC ACID-NACL 1000-0.7 MG/100ML-% IV SOLN
1000.0000 mg | INTRAVENOUS | Status: AC
  Administered 2021-06-22: 1000 mg via INTRAVENOUS

## 2021-06-22 MED ORDER — BUPIVACAINE-EPINEPHRINE (PF) 0.25% -1:200000 IJ SOLN
INTRAMUSCULAR | Status: AC
  Filled 2021-06-22: qty 30

## 2021-06-22 MED ORDER — CEFAZOLIN SODIUM-DEXTROSE 2-4 GM/100ML-% IV SOLN
INTRAVENOUS | Status: AC
  Administered 2021-06-22: 2 g via INTRAVENOUS
  Filled 2021-06-22: qty 100

## 2021-06-22 MED ORDER — OXYCODONE HCL 5 MG PO TABS: 5.0000 mg | ORAL_TABLET | Freq: Once | ORAL | Status: DC | PRN

## 2021-06-22 MED ORDER — LACTATED RINGERS IV SOLN: INTRAVENOUS | Status: DC | PRN

## 2021-06-22 MED ORDER — DEXAMETHASONE SODIUM PHOSPHATE 10 MG/ML IJ SOLN
INTRAMUSCULAR | Status: DC | PRN
  Administered 2021-06-22: 5 mg via INTRAVENOUS

## 2021-06-22 MED ORDER — CEFAZOLIN SODIUM-DEXTROSE 2-4 GM/100ML-% IV SOLN
2.0000 g | INTRAVENOUS | Status: AC
  Administered 2021-06-22: 2 g via INTRAVENOUS

## 2021-06-22 MED ORDER — CEFAZOLIN SODIUM-DEXTROSE 2-4 GM/100ML-% IV SOLN
INTRAVENOUS | Status: AC
  Filled 2021-06-22: qty 100

## 2021-06-22 MED ORDER — FENTANYL CITRATE (PF) 100 MCG/2ML IJ SOLN
INTRAMUSCULAR | Status: AC
  Administered 2021-06-22: 50 ug via INTRAVENOUS
  Filled 2021-06-22: qty 2

## 2021-06-22 MED ORDER — SEVOFLURANE IN SOLN
RESPIRATORY_TRACT | Status: AC
  Filled 2021-06-22: qty 250

## 2021-06-22 MED ORDER — PROPOFOL 10 MG/ML IV BOLUS
INTRAVENOUS | Status: AC
  Filled 2021-06-22: qty 20

## 2021-06-22 MED ORDER — PHENYLEPHRINE HCL (PRESSORS) 10 MG/ML IV SOLN
INTRAVENOUS | Status: DC | PRN
  Administered 2021-06-22 (×4): 100 ug via INTRAVENOUS
  Administered 2021-06-22: 200 ug via INTRAVENOUS
  Administered 2021-06-22 (×5): 100 ug via INTRAVENOUS

## 2021-06-22 MED ORDER — ONDANSETRON HCL 4 MG/2ML IJ SOLN
INTRAMUSCULAR | Status: DC | PRN
  Administered 2021-06-22: 4 mg via INTRAVENOUS

## 2021-06-22 MED ORDER — LIDOCAINE HCL (PF) 1 % IJ SOLN
INTRAMUSCULAR | Status: AC
  Filled 2021-06-22: qty 5

## 2021-06-22 MED ORDER — SUGAMMADEX SODIUM 200 MG/2ML IV SOLN
INTRAVENOUS | Status: DC | PRN
  Administered 2021-06-22: 220.6 mg via INTRAVENOUS

## 2021-06-22 MED ORDER — TRANEXAMIC ACID 1000 MG/10ML IV SOLN
INTRAVENOUS | Status: DC | PRN
  Administered 2021-06-22: 1000 mg via INTRAVENOUS

## 2021-06-22 MED ORDER — BUPIVACAINE HCL (PF) 0.5 % IJ SOLN
INTRAMUSCULAR | Status: AC
  Filled 2021-06-22: qty 10

## 2021-06-22 MED ORDER — BUPIVACAINE LIPOSOME 1.3 % IJ SUSP
INTRAMUSCULAR | Status: AC
  Filled 2021-06-22: qty 20

## 2021-06-22 MED ORDER — VANCOMYCIN HCL 1000 MG IV SOLR
INTRAVENOUS | Status: DC | PRN
  Administered 2021-06-22: 1000 mg via TOPICAL

## 2021-06-22 MED ORDER — PHENYLEPHRINE HCL (PRESSORS) 10 MG/ML IV SOLN
INTRAVENOUS | Status: AC
  Filled 2021-06-22: qty 1

## 2021-06-22 MED ORDER — FENTANYL CITRATE (PF) 100 MCG/2ML IJ SOLN: 50.0000 ug | Freq: Once | INTRAMUSCULAR | Status: AC

## 2021-06-22 MED ORDER — BUPIVACAINE LIPOSOME 1.3 % IJ SUSP
INTRAMUSCULAR | Status: DC | PRN
  Administered 2021-06-22: 13 mL via PERINEURAL
  Administered 2021-06-22: 7 mL via PERINEURAL

## 2021-06-22 MED ORDER — ASPIRIN EC 325 MG PO TBEC: 325.0000 mg | DELAYED_RELEASE_TABLET | Freq: Every day | ORAL | 0 refills | Status: AC

## 2021-06-22 MED ORDER — SODIUM CHLORIDE 0.9 % IV SOLN
INTRAVENOUS | Status: DC | PRN
  Administered 2021-06-22: 30 ug/min via INTRAVENOUS

## 2021-06-22 MED ORDER — OXYCODONE HCL 5 MG/5ML PO SOLN: 5.0000 mg | Freq: Once | ORAL | Status: DC | PRN

## 2021-06-22 MED ORDER — ACETAMINOPHEN 10 MG/ML IV SOLN
INTRAVENOUS | Status: DC | PRN
  Administered 2021-06-22: 1000 mg via INTRAVENOUS

## 2021-06-22 MED ORDER — ORAL CARE MOUTH RINSE: 15.0000 mL | Freq: Once | OROMUCOSAL | Status: AC

## 2021-06-22 MED ORDER — ACETAMINOPHEN 500 MG PO TABS: 1000.0000 mg | ORAL_TABLET | Freq: Three times a day (TID) | ORAL | 2 refills | Status: DC

## 2021-06-22 MED ORDER — 0.9 % SODIUM CHLORIDE (POUR BTL) OPTIME
TOPICAL | Status: DC | PRN
  Administered 2021-06-22: 1000 mL

## 2021-06-22 MED ORDER — LACTATED RINGERS IV SOLN: INTRAVENOUS | Status: DC

## 2021-06-22 MED ORDER — TRANEXAMIC ACID 1000 MG/10ML IV SOLN
INTRAVENOUS | Status: AC
  Filled 2021-06-22: qty 10

## 2021-06-22 MED ORDER — BUPIVACAINE HCL (PF) 0.5 % IJ SOLN
INTRAMUSCULAR | Status: DC | PRN
  Administered 2021-06-22: 3 mL via PERINEURAL
  Administered 2021-06-22: 7 mL via PERINEURAL

## 2021-06-22 MED ORDER — FENTANYL CITRATE (PF) 100 MCG/2ML IJ SOLN
INTRAMUSCULAR | Status: AC
  Filled 2021-06-22: qty 2

## 2021-06-22 MED ORDER — MIDAZOLAM HCL 2 MG/2ML IJ SOLN: 1.0000 mg | Freq: Once | INTRAMUSCULAR | Status: AC

## 2021-06-22 MED ORDER — MIDAZOLAM HCL 2 MG/2ML IJ SOLN
INTRAMUSCULAR | Status: AC
  Administered 2021-06-22: 1 mg via INTRAVENOUS
  Filled 2021-06-22: qty 2

## 2021-06-22 MED ORDER — OXYCODONE HCL 5 MG PO TABS: 5.0000 mg | ORAL_TABLET | ORAL | 0 refills | Status: DC | PRN

## 2021-06-22 MED ORDER — CEFAZOLIN SODIUM-DEXTROSE 2-4 GM/100ML-% IV SOLN: 2.0000 g | Freq: Once | INTRAVENOUS | Status: AC

## 2021-06-22 SURGICAL SUPPLY — 85 items
APL PRP STRL LF DISP 70% ISPRP (MISCELLANEOUS) ×1
AUG BASEPLATE 15DEG 25 WEDGE (Joint) ×3 IMPLANT
AUGMENT BASEPLATE 15DEG 25 WDG (Joint) ×1 IMPLANT
BIT DRILL 3.2 PERIPHERAL SCREW (BIT) ×3 IMPLANT
BLADE SAGITTAL WIDE XTHICK NO (BLADE) ×3 IMPLANT
BONE SCREW THREAD 6.5X35MM (Screw) ×1 IMPLANT
BSPLAT GLND 15D 25 FULL WDG (Joint) ×1 IMPLANT
CANISTER SUCT 1200ML W/VALVE (MISCELLANEOUS) ×3 IMPLANT
CHLORAPREP W/TINT 26 (MISCELLANEOUS) ×3 IMPLANT
CLOSURE WOUND 1/2 X4 (GAUZE/BANDAGES/DRESSINGS) ×1
CNTNR SPEC 2.5X3XGRAD LEK (MISCELLANEOUS) ×1
CONT SPEC 4OZ STER OR WHT (MISCELLANEOUS) ×2
CONT SPEC 4OZ STRL OR WHT (MISCELLANEOUS) ×1
CONTAINER SPEC 2.5X3XGRAD LEK (MISCELLANEOUS) ×1 IMPLANT
COOLER POLAR GLACIER W/PUMP (MISCELLANEOUS) ×3 IMPLANT
COVER BACK TABLE REUSABLE LG (DRAPES) ×3 IMPLANT
COVER WAND RF STERILE (DRAPES) ×3 IMPLANT
DRAPE 3/4 80X56 (DRAPES) ×6 IMPLANT
DRAPE IMP U-DRAPE 54X76 (DRAPES) ×6 IMPLANT
DRAPE INCISE IOBAN 66X45 STRL (DRAPES) ×6 IMPLANT
DRAPE U-SHAPE 47X51 STRL (DRAPES) ×3 IMPLANT
DRSG OPSITE POSTOP 3X4 (GAUZE/BANDAGES/DRESSINGS) ×3 IMPLANT
DRSG OPSITE POSTOP 4X6 (GAUZE/BANDAGES/DRESSINGS) ×3 IMPLANT
DRSG OPSITE POSTOP 4X8 (GAUZE/BANDAGES/DRESSINGS) ×3 IMPLANT
DRSG TEGADERM 2-3/8X2-3/4 SM (GAUZE/BANDAGES/DRESSINGS) ×3 IMPLANT
DRSG TEGADERM 4X10 (GAUZE/BANDAGES/DRESSINGS) ×9 IMPLANT
ELECT BLADE 6.5 EXT (BLADE) ×3 IMPLANT
ELECT REM PT RETURN 9FT ADLT (ELECTROSURGICAL) ×3
ELECTRODE REM PT RTRN 9FT ADLT (ELECTROSURGICAL) ×1 IMPLANT
GAUZE 4X4 16PLY ~~LOC~~+RFID DBL (SPONGE) ×3 IMPLANT
GAUZE XEROFORM 1X8 LF (GAUZE/BANDAGES/DRESSINGS) ×3 IMPLANT
GLENOSPHERE REV SHOULDER 36 (Joint) ×3 IMPLANT
GLOVE SRG 8 PF TXTR STRL LF DI (GLOVE) ×2 IMPLANT
GLOVE SURG ORTHO LTX SZ8 (GLOVE) ×6 IMPLANT
GLOVE SURG SYN 8.0 (GLOVE) ×3 IMPLANT
GLOVE SURG UNDER POLY LF SZ8 (GLOVE) ×6
GOWN STRL REUS W/ TWL LRG LVL3 (GOWN DISPOSABLE) ×2 IMPLANT
GOWN STRL REUS W/ TWL XL LVL3 (GOWN DISPOSABLE) ×1 IMPLANT
GOWN STRL REUS W/TWL LRG LVL3 (GOWN DISPOSABLE) ×6
GOWN STRL REUS W/TWL XL LVL3 (GOWN DISPOSABLE) ×3
GUIDE GLENOID PATIENT REVERSED (MISCELLANEOUS) ×3 IMPLANT
GUIDEWIRE GLENOID 2.5X220 (WIRE) ×3 IMPLANT
HEMOVAC 400CC 10FR (MISCELLANEOUS) ×3 IMPLANT
HOOD PEEL AWAY FLYTE STAYCOOL (MISCELLANEOUS) ×9 IMPLANT
IMPL REVERSE SHOULDER 0X3.5 (Shoulder) ×1 IMPLANT
IMPLANT REVERSE SHOULDER 0X3.5 (Shoulder) ×3 IMPLANT
INSERT HUMERAL 36X6MM 12.5DEG (Insert) ×3 IMPLANT
KIT STABILIZATION SHOULDER (MISCELLANEOUS) ×3 IMPLANT
MANIFOLD NEPTUNE II (INSTRUMENTS) ×3 IMPLANT
MASK FACE SPIDER DISP (MASK) ×3 IMPLANT
MAT ABSORB  FLUID 56X50 GRAY (MISCELLANEOUS) ×6
MAT ABSORB FLUID 56X50 GRAY (MISCELLANEOUS) ×2 IMPLANT
NEEDLE REVERSE CUT 1/2 CRC (NEEDLE) ×3 IMPLANT
NEEDLE SPNL 20GX3.5 QUINCKE YW (NEEDLE) ×3 IMPLANT
NS IRRIG 1000ML POUR BTL (IV SOLUTION) ×3 IMPLANT
PACK ARTHROSCOPY SHOULDER (MISCELLANEOUS) ×3 IMPLANT
PAD WRAPON POLAR SHDR XLG (MISCELLANEOUS) ×1 IMPLANT
PASSER SUT SWANSON 36MM LOOP (INSTRUMENTS) IMPLANT
PENCIL SMOKE EVACUATOR (MISCELLANEOUS) ×3 IMPLANT
PULSAVAC PLUS IRRIG FAN TIP (DISPOSABLE) ×3
RETRIEVER SUT HEWSON (MISCELLANEOUS) IMPLANT
SCREW 5.5X26 (Screw) ×6 IMPLANT
SCREW BONE THREAD 6.5X35 (Screw) ×2 IMPLANT
SCREW PERIPHERAL 30 (Screw) ×6 IMPLANT
SLING ULTRA II LG (MISCELLANEOUS) ×3 IMPLANT
SLING ULTRA II M (MISCELLANEOUS) ×3 IMPLANT
SPONGE T-LAP 18X18 ~~LOC~~+RFID (SPONGE) ×15 IMPLANT
STAPLER SKIN PROX 35W (STAPLE) IMPLANT
STEM HUMERAL SZ2B STND 70 PTC (Stem) ×3 IMPLANT
STEM HUMERAL SZ2BSTD 70 PTC (Stem) ×1 IMPLANT
STRAP SAFETY 5IN WIDE (MISCELLANEOUS) ×6 IMPLANT
STRIP CLOSURE SKIN 1/2X4 (GAUZE/BANDAGES/DRESSINGS) ×2 IMPLANT
SUT FIBERWIRE #2 38 BLUE 1/2 (SUTURE) ×12
SUT MNCRL AB 4-0 PS2 18 (SUTURE) IMPLANT
SUT PROLENE 6 0 P 1 18 (SUTURE) ×3 IMPLANT
SUT TICRON 2-0 30IN 311381 (SUTURE) ×9 IMPLANT
SUT VIC AB 0 CT1 36 (SUTURE) ×3 IMPLANT
SUT VIC AB 2-0 CT2 27 (SUTURE) ×6 IMPLANT
SUTURE FIBERWR #2 38 BLUE 1/2 (SUTURE) ×4 IMPLANT
SYR 20ML LL LF (SYRINGE) ×3 IMPLANT
SYR 30ML LL (SYRINGE) ×6 IMPLANT
TIP FAN IRRIG PULSAVAC PLUS (DISPOSABLE) ×1 IMPLANT
TRAY FOLEY SLVR 16FR LF STAT (SET/KITS/TRAYS/PACK) IMPLANT
WATER STERILE IRR 1000ML POUR (IV SOLUTION) ×3 IMPLANT
WRAPON POLAR PAD SHDR XLG (MISCELLANEOUS) ×3

## 2021-06-22 NOTE — Discharge Instructions (Addendum)
Jasmine H. Patel, MD  [facility]  Phone: [phone]  Fax: [phone]   Discharge Instructions after Reverse Shoulder Replacement    1. Activity/Sling: You are to be non-weight bearing on operative extremity. A sling/shoulder immobilizer has been provided for you. Only remove the sling to perform elbow, wrist, and hand RoM exercises and hygiene/dressing. Active reaching and lifting are not permitted. You will be given further instructions on sling use at your first physical therapy visit and postoperative visit with Dr. Patel.   2. Dressings: Dressing may be removed at 1st physical therapy visit (~3-4 days after surgery). Afterwards, you may either leave open to air (if no drainage) or cover with dry, sterile dressing. If you have steri-strips on your wound, please do not remove them. They will fall off on their own. You may shower 5 days after surgery. Please pat incision dry. Do not rub or place any shear forces across incision. If there is drainage or any opening of incision after 5 days, please notify our offices immediately.    3. Driving:  Plan on not driving for six weeks. Please note that you are advised NOT to drive while taking narcotic pain medications as you may be impaired and unsafe to drive.   4. Medications:  - You have been provided a prescription for narcotic pain medicine (usually oxycodone). After surgery, take 1-2 narcotic tablets every 4 hours if needed for severe pain. Please start this as soon as you begin to start having pain (if you received a nerve block, start taking as soon as this wears off).  - A prescription for anti-nausea medication will be provided in case the narcotic medicine causes nausea - take 1 tablet every 6 hours only if nauseated.  - Take enteric coated aspirin 325 mg once daily for 6 weeks to prevent blood clots. Do not take aspirin if you have an aspirin sensitivity/allergy or asthma or are on an anticoagulant (blood thinner) already. If so, then  your home anticoagulant will be resume and managed - do not take aspirin. -Take tylenol 1000mg (2 Extra strength or 3 regular strength tablets) every 8 hours for pain. This will reduce the amount of narcotic medication needed. May stop tylenol when you are having minimal pain. - Take a stool softener (Colace, Dulcolax or Senakot) if you are using narcotic pain medications to help with constipation that is associated with narcotic use. - DO NOT take ANY nonsteroidal anti-inflammatory pain medications: Advil, Motrin, Ibuprofen, Aleve, Naproxen, or Naprosyn.   If you are taking prescription medication for anxiety, depression, insomnia, muscle spasm, chronic pain, or for attention deficit disorder you are advised that you are at a higher risk of adverse effects with use of narcotics post-op, including narcotic addiction/dependence, depressed breathing, death. If you use non-prescribed substances: alcohol, marijuana, cocaine, heroin, methamphetamines, etc., you are at a higher risk of adverse effects with use of narcotics post-op, including narcotic addiction/dependence, depressed breathing, death. You are advised that taking > 50 morphine milligram equivalents (MME) of narcotic pain medication per day results in twice the risk of overdose or death. For your prescription provided: oxycodone 5 mg - taking more than 6 tablets per day after the first few days of surgery.   5. Physical Therapy: 1-2 times per week for ~12 weeks. Therapy typically starts on post operative Day 3 or 4. You have been provided an order for physical therapy. The therapist will provide home exercises. Please contact our offices if this appointment has not been scheduled.      6. Work: May do light duty/desk job in approximately 2 weeks when off of narcotics, pain is well-controlled, and swelling has decreased if able to function with one arm in sling. Full work may take 6 weeks if light motions and function of both arms is required.  Lifting jobs may require 12 weeks.   7. Post-Op Appointments: Your first post-op appointment will be with Dr. Patel in approximately 2 weeks time.    If you find that they have not been scheduled please call the Orthopaedic Appointment front desk at [phone].                               Jasmine H. Patel, MD [facility] Phone: [phone] Fax: [phone]   REVERSE SHOULDER ARTHROPLASTY REHAB GUIDELINES   These guidelines should be tailored to individual patients based on their rehab goals, age, precautions, quality of repair, etc.  Progression should be based on patient progress and approval by the referring physician.  PHASE 1 - Day 1 through Week 2  GENERAL GUIDELINES AND PRECAUTIONS Sling wear 24/7 except during grooming and home exercises (3 to 5 times daily) Avoid shoulder extension such that the arm is posterior the frontal plane.  When patients recline, a pillow should be placed behind the upper arm and sling should be on.  They should be advised to always be able to see the elbow Avoid combined IR/ADD/EXT, such as hand behind back to prevent dislocation Avoid combined IR and ADD such as reaching across the chest to prevent dislocation No AROM No submersion in pool/water for 4 weeks No weight bearing through operative arm (as in transfers, walker use, etc.)  GOALS Maintain integrity of joint replacement; protect soft tissue healing Increase PROM for elevation to 120 and ER to 30 (will remain the goal for first 6 weeks) Optimize distal UE circulation and muscle activity (elbow, wrist and hand) Instruct in use of sling for proper fit, polar care device for ice application after HEP, signs/symptoms of infection  EXERCISES Active elbow, wrist and hand Passive forward elevation in scapular plane to 90-120 max motion; ER in scapular plane to 30 Active scapular retraction with arms resting in neutral position  CRITERIA TO PROGRESS TO  PHASE 2 Low pain (less than 3/10) with shoulder PROM Healing of incision without signs of infection Clearance by MD to advance after 2 week MD check up  PHASE 2 - 2 weeks - 6 weeks  GENERAL GUIDELINES AND PRECAUTIONS Sling may be removed while at home; worn in community without abduction pillow May use arm for light activities of daily living (such as feeding, brushing teeth, dressing.) with elbow near  the side of the body  and arm in front of the body- no active lifting of the arm May submerge in water (tub, pool, Jacuzzi, etc.) after 4 weeks Continue to avoid WBing through the operative arm Continue to avoid combined IR/EXT/ADD (hand behind the back) and IR/ADD  (reaching across chest) for dislocation precautions  GOALS  Achieve passive elevation to 120 and ER to 30  Low (less than 3/10) to no pain  Ability to fire all heads of the deltoid  EXERCISES May discontinue grip, and active elbow and wrist exercises since using the arm in ADL's  with sling removed around the home Continue passive elevation to 120 and ER to 30, both in scapular plane with arm supported on table top Add submaximal isometrics, pain free effort, for all   functional heads of deltoid (anterior, posterior, middle)  Ensure that with posterior deltoid isometric the shoulder does not move into extension and the arm remains anterior the frontal plane At 4 weeks:  begin to place arm in balanced position of 90 deg elevation in supine; when patient able to hold this position with ease, may begin reverse pendulums clockwise and counterclockwise  CRITERIA TO PROGRESS TO PHASE 3 Passive forward elevation in scapular plane to 120; passive ER in scapular plane to 30 Ability to fire isometrically all heads of the deltoid muscle without pain Ability to place and hold the arm in balanced position (90 deg elevation in supine)  PHASE 3 - 6 weeks to 3 months  GENERAL GUIDELINES AND PRECAUTIONS Discontinue use of sling Avoid  forcing end range motion in any direction to prevent dislocation  May advance use of the arm actively in ADL's without being restricted to arm by the side of the body, however, avoid heavy lifting and sports (forever!) May initiate functional IR behind the back gently NO UPPER BODY ERGOMETER   GOALS Optimize PROM for elevation and ER in scapular plane with realistic expectation that max  mobility for elevation is usually around 145-160 passively; ER 40 to 50 passively; functional IR to L1 Recover AROM to approach as close to PROM available as possible; may expect 135-150 deg active elevation; 30 deg active ER; active functional IR to L1 Establish dynamic stability of the shoulder with deltoid and periscapular muscle gradual strengthening  EXERCISES Forward elevation in scapular plane active progression: supine to incline, to vertical; short to long lever arm Balanced position long lever arm AROM Active ER/IR with arm at side Scapular retraction with light band resistance Functional IR with hand slide up back - very gentle and gradual NO UPPER BODY ERGOMETER     CRITERIA TO PROGRESS TO PHASE 4  AROM equals/approaches PROM with good mechanics for elevation   No pain  Higher level demand on shoulder than ADL functions   PHASE 4 12 months and beyond  GENERAL GUIDELINES AND PRECAUTIONS No heavy lifting and no overhead sports No heavy pushing activity Gradually increase strength of deltoid and scapular stabilizers; also the rotator cuff if present with weights not to exceed 5 lbs NO UPPER BODY ERGOMETER   GOALS  Optimize functional use of the operative UE to meet the desired demands  Gradual increase in deltoid, scapular muscle, and rotator cuff strength  Pain free functional activities   EXERCISES Add light hand weights for deltoid up to and not to exceed 3 lbs for anterior and posterior with long arm lift against gravity; elbow bent to 90 deg for abduction in scapular  plane Theraband progression for extension to hip with scapular depression/retraction Theraband progression for serratus anterior punches in supine; avoid wall, incline or prone pressups for serratus anterior End range stretching gently without forceful overpressure in all planes (elevation in scapular plane, ER in scapular plane, functional IR) with stretching done for life as part of a daily routine NO UPPER BODY ERGOMETER     CRITERIA FOR DISCHARGE FROM SKILLED PHYSICAL THERAPY  Pain free AROM for shoulder elevation (expect around 135-150)  Functional strength for all ADL's, work tasks, and hobbies approved by surgeon  Independence with home maintenance program   NOTES: 1. With proper exercise, motion, strength, and function continue to improve even after one year. 2. The complication rate after surgery is 5 - 8%. Complications include infection, fracture, heterotopic bone formation, nerve injury, instability, rotator cuff   tear, and tuberosity nonunion. Please look for clinical signs, unusual symptoms, or lack of progress with therapy and report those to Dr. Posey Pronto. Prefer more communication than less.  3. The therapy plan above only serves as a guide. Please be aware of specific individualized patient instructions as written on the prescription or through discussions with the surgeon. 4. Please call Dr. Posey Pronto if you have any specific questions or concerns [phone]   AMBULATORY SURGERY  DISCHARGE INSTRUCTIONS   The drugs that you were given will stay in your system until tomorrow so for the next 24 hours you should not:  Drive an automobile Make any legal decisions Drink any alcoholic beverage   You may resume regular meals tomorrow.  Today it is better to start with liquids and gradually work up to solid foods.  You may eat anything you prefer, but it is better to start with liquids, then soup and crackers, and gradually work up to solid foods.   Please notify your doctor  immediately if you have any unusual bleeding, trouble breathing, redness and pain at the surgery site, drainage, fever, or pain not relieved by medication.    Additional Instructions:    Please contact your physician with any problems or Same Day Surgery at [phone], Monday through Friday 6 am to 4 pm, or Reform at [facility] number at [phone].        Interscalene Nerve Block with Exparel   For your surgery you have received an Interscalene Nerve Block with Exparel. Nerve Blocks affect many types of nerves, including nerves that control movement, pain and normal sensation.  You may experience feelings such as numbness, tingling, heaviness, weakness or the inability to move your arm or the feeling or sensation that your arm has "fallen asleep". A nerve block with Exparel can last up to 5 days.  Usually the weakness wears off first.  The tingling and heaviness usually wear off next.  Finally you may start to notice pain.  Keep in mind that this may occur in any order.  Once a nerve block starts to wear off it is usually completely gone within 60 minutes. ISNB may cause mild shortness of breath, a hoarse voice, blurry vision, unequal pupils, or drooping of the face on the same side as the nerve block.  These symptoms will usually resolve with the numbness.  Very rarely the procedure itself can cause mild seizures. If needed, your surgeon will give you a prescription for pain medication.  It will take about 60 minutes for the oral pain medication to become fully effective.  So, it is recommended that you start taking this medication before the nerve block first begins to wear off, or when you first begin to feel discomfort. Take your pain medication only as prescribed.  Pain medication can cause sedation and decrease your breathing if you take more than you need for the level of pain that you have. Nausea is a common side effect of many pain medications.  You may want to eat  something before taking your pain medicine to prevent nausea. After an Interscalene nerve block, you cannot feel pain, pressure or extremes in temperature in the effected arm.  Because your arm is numb it is at an increased risk for injury.  To decrease the possibility of injury, please practice the following:  While you are awake change the position of your arm frequently to prevent too much pressure on any one area for prolonged periods of time.  If you  have a cast or tight dressing, check the color or your fingers every couple of hours.  Call your surgeon with the appearance of any discoloration (white or blue). If you are given a sling to wear before you go home, please wear it  at all times until the block has completely worn off.  Do not get up at night without your sling. Please contact [facility] or your surgeon if you do not begin to regain sensation after 7 days from the surgery.  Anesthesia may be contacted by calling the Same Day Surgery Department, Mon. through Fri., 6 am to 4 pm at [phone].   If you experience any other problems or concerns, please contact your surgeon's office. If you experience severe or prolonged shortness of breath go to the nearest emergency department.

## 2021-06-22 NOTE — Anesthesia Procedure Notes (Signed)
Procedure Name: Intubation Date/Time: 06/22/2021 7:38 AM Performed by: Willette Alma, CRNA Pre-anesthesia Checklist: Patient identified, Patient being monitored, Timeout performed, Emergency Drugs available and Suction available Patient Re-evaluated:Patient Re-evaluated prior to induction Oxygen Delivery Method: Circle system utilized Preoxygenation: Pre-oxygenation with 100% oxygen Induction Type: IV induction Ventilation: Mask ventilation without difficulty Laryngoscope Size: 4 and McGraph Grade View: Grade I Tube type: Oral Tube size: 7.0 mm Number of attempts: 1 Airway Equipment and Method: Stylet Placement Confirmation: ETT inserted through vocal cords under direct vision, positive ETCO2 and breath sounds checked- equal and bilateral Secured at: 21 cm Tube secured with: Tape Dental Injury: Teeth and Oropharynx as per pre-operative assessment

## 2021-06-22 NOTE — Anesthesia Preprocedure Evaluation (Signed)
Anesthesia Evaluation  Patient identified by MRN, date of birth, ID band Patient awake    Reviewed: Allergy & Precautions, NPO status , Patient's Chart, lab work & pertinent test results  History of Anesthesia Complications Negative for: history of anesthetic complications  Airway Mallampati: III  TM Distance: <3 FB Neck ROM: full    Dental  (+) Chipped   Pulmonary neg pulmonary ROS, neg shortness of breath, neg COPD,    Pulmonary exam normal        Cardiovascular Exercise Tolerance: Good (-) angina(-) Past MI negative cardio ROS Normal cardiovascular exam     Neuro/Psych PSYCHIATRIC DISORDERS negative neurological ROS     GI/Hepatic negative GI ROS, Neg liver ROS,   Endo/Other  negative endocrine ROS  Renal/GU Renal disease     Musculoskeletal   Abdominal   Peds  Hematology negative hematology ROS (+)   Anesthesia Other Findings Past Medical History: No date: Cancer ([place]) No date: Dyspnea  Past Surgical History: No date: CHOLECYSTECTOMY  BMI    Body Mass Index: 41.74 kg/m      Reproductive/Obstetrics negative OB ROS                             Anesthesia Physical Anesthesia Plan  ASA: 3  Anesthesia Plan: General ETT   Post-op Pain Management: GA combined w/ Regional for post-op pain   Induction: Intravenous  PONV Risk Score and Plan: Ondansetron, Dexamethasone, Midazolam and Treatment may vary due to age or medical condition  Airway Management Planned: Oral ETT  Additional Equipment:   Intra-op Plan:   Post-operative Plan: Extubation in OR  Informed Consent: I have reviewed the patients History and Physical, chart, labs and discussed the procedure including the risks, benefits and alternatives for the proposed anesthesia with the patient or authorized representative who has indicated his/her understanding and acceptance.     Dental Advisory Given  Plan  Discussed with: Anesthesiologist, CRNA and Surgeon  Anesthesia Plan Comments: (Patient consented for risks of anesthesia including but not limited to:  - adverse reactions to medications - damage to eyes, teeth, lips or other oral mucosa - nerve damage due to positioning  - sore throat or hoarseness - Damage to heart, brain, nerves, lungs, other parts of body or loss of life  Patient voiced understanding.)        Anesthesia Quick Evaluation

## 2021-06-22 NOTE — Evaluation (Signed)
Occupational Therapy Evaluation Patient Details Name: Jasmine Franco MRN: [id number] DOB: 1966/02/06 Today's Date: 06/22/2021    History of Present Illness Pt admitted for L RSA.   Clinical Impression   [name] was seen for an OT evaluation POD#0 from above surgery. Patient presents with impaired strength/ROM/sensation to LUE with block not completely resolved yet. These impairments result in a decreased ability to perform self care tasks requiring MAX A don/doff shirt, polar care, and sling. Pt requires SBA + R arm rest for ADL t/f. Of note, RN reporting that pt does not require use of shoulder sling abduction pillow per MD.   Pt and family member instructed in polar care mgt, sling/immobilizer mgt, ROM exercises for LUE, functional application of LUE NWBing precautions, adaptive strategies for bathing/dressing/toileting/grooming, positioning for sleep, and home/routines modifications. Handout provided. Pt verbalized understanding of all education/training provided. Pt will benefit from skilled OT services to address these limitations and improve independence in daily tasks. Recommend to follow surgeon's recommendation for DC plan and follow-up therapies. Recommend 3-in-1 as pt requires use of R arm rest for safe ADL t/f - RN and CSW notified pt will require BSC prior to d/c.       Follow Up Recommendations  Follow surgeon's recommendation for DC plan and follow-up therapies    Equipment Recommendations  3 in 1 bedside commode    Recommendations for Other Services       Precautions / Restrictions Precautions Precautions: Fall;Shoulder Shoulder Interventions: Shoulder sling/immobilizer;At all times;Off for dressing/bathing/exercises (per RN, MD requesting no abduction pillow) Precaution Booklet Issued: Yes (comment) Required Braces or Orthoses: Sling Other Brace: shoulder abduction brace Restrictions Weight Bearing Restrictions: Yes LUE Weight Bearing: Non weight bearing       Mobility Bed Mobility               General bed mobility comments: NT, received in recliner    Transfers Overall transfer level: Needs assistance Equipment used: None Transfers: Sit to/from Stand Sit to Stand: Supervision         General transfer comment: Pt requires use of R arm rest to stand with SBA    Balance Overall balance assessment: Mild deficits observed, not formally tested                                         ADL either performed or assessed with clinical judgement   ADL Overall ADL's : Needs assistance/impaired                                       General ADL Comments: MAX A don/doff LUE sling, polar care, and shirt sitting EOC. MAX A don B socks sitting EOC. SBA + R arm rest for ADL t/f      Pertinent Vitals/Pain Pain Assessment: Faces Faces Pain Scale: Hurts little more Pain Location: neck (from shoulder brace) Pain Descriptors / Indicators: Aching Pain Intervention(s): Repositioned;Ice applied     Hand Dominance Right   Extremity/Trunk Assessment Upper Extremity Assessment Upper Extremity Assessment: Overall WFL for tasks assessed;LUE deficits/detail LUE: Unable to fully assess due to immobilization;Unable to fully assess due to pain   Lower Extremity Assessment Lower Extremity Assessment: Overall WFL for tasks assessed       Communication Communication Communication: No difficulties   Cognition Arousal/Alertness: Awake/alert Behavior  During Therapy: WFL for tasks assessed/performed Overall Cognitive Status: Within Functional Limits for tasks assessed                                        Exercises Exercises: Other exercises Other Exercises Other Exercises: Pt educated re: OT role, DME recs, d/c recs, falls prevention, ECS, polar care mgmt, adapted dressing techniques, sling mgmt, NWBing pcns Other Exercises: LBD, UBD, sit<>stand, sitting/standing balance/toelrance, don/doff  polar care/sling   Shoulder Instructions      Home Living Family/patient expects to be discharged to:: Private residence Living Arrangements: Alone Available Help at Discharge: Family Type of Home: House Home Access: Stairs to enter Technical brewer of Steps: 1 Entrance Stairs-Rails:  (hold to side of wall) Home Layout: One level               Home Equipment: None          Prior Functioning/Environment Level of Independence: Independent        Comments: reports she was indep with all ADLs prior to admission. Does endorce 1 fall earlier this week        OT Problem List: Decreased strength;Decreased range of motion;Decreased activity tolerance;Impaired balance (sitting and/or standing);Decreased safety awareness;Decreased knowledge of precautions;Impaired UE functional use         OT Goals(Current goals can be found in the care plan section) Acute Rehab OT Goals Patient Stated Goal: to go home OT Goal Formulation: With patient/family Time For Goal Achievement: 07/06/21 Potential to Achieve Goals: Good ADL Goals Pt Will Perform Grooming: with set-up;with supervision;standing Pt Will Perform Lower Body Dressing: sit to/from stand;Independently Pt Will Perform Toileting - Clothing Manipulation and hygiene: with modified independence;sitting/lateral leans   AM-PAC OT "6 Clicks" Daily Activity     Outcome Measure Help from another person eating meals?: A Little Help from another person taking care of personal grooming?: A Little Help from another person toileting, which includes using toliet, bedpan, or urinal?: A Little Help from another person bathing (including washing, rinsing, drying)?: A Little Help from another person to put on and taking off regular upper body clothing?: A Lot Help from another person to put on and taking off regular lower body clothing?: A Lot 6 Click Score: 16   End of Session Nurse Communication: Other (comment) (RN and CSW notifed  pt will require BSC for d/c)  Activity Tolerance: Patient tolerated treatment well Patient left: in chair;with call bell/phone within reach;with family/visitor present  OT Visit Diagnosis: Other abnormalities of gait and mobility (R26.89)                Time: 1400-1445 OT Time Calculation (min): 45 min Charges:  OT General Charges $OT Visit: 1 Visit OT Evaluation $OT Eval Low Complexity: 1 Low OT Treatments $Self Care/Home Management : 38-52 mins  Dessie Coma, M.S. OTR/L  06/22/21, 3:45 PM  ascom [phone]

## 2021-06-22 NOTE — Progress Notes (Signed)
The Hemovac was removed from the left shoulder with no complication.  No complications occurred.  The Hemovac tubing was intact on removal.

## 2021-06-22 NOTE — Op Note (Signed)
Operative Note    SURGERY DATE: 06/22/2021   PRE-OP DIAGNOSIS:  1. Left shoulder glenohumeral osteoarthritis 2. Left shoulder rotator cuff tear   POST-OP DIAGNOSIS:  1. Left shoulder glenohumeral osteoarthritis 2. Left shoulder rotator cuff tear  PROCEDURES:  1. Left reverse total shoulder arthroplasty 2. Left biceps tenodesis   SURGEON: Cato Mulligan, MD   ASSISTANT: Anitra Lauth, PA  ANESTHESIA: Gen + interscalene block   ESTIMATED BLOOD LOSS: 100cc   TOTAL IV FLUIDS: See anesthesia record  IMPLANTS: Tornier: Perform plus reversed full wedge augment baseplate 15 degree with 6.5x72mm compression screw; posterior compression screw, superior locking screw, inferior locking screw, and anterior locking screw to glenoid baseplate; 36 mm, glenosphere; size 2B standard humeral stem; standard reversed tray; standard reversed insert +6 mm   INDICATION(S):  Jasmine Franco is a 55 y.o. female with chronic shoulder pain and stiffness.  Imaging consistent with severe glenohumeral osteoarthritis, rotator cuff tear, and B2 glenoid with significant retroversion and posterior subluxation.  Conservative measures have not provided adequate relief. After discussion of risks, benefits, and alternatives to surgery, the patient elected to proceed with reverse shoulder arthroplasty and biceps tenodesis.   OPERATIVE FINDINGS: Severe glenohumeral osteoarthritis, supraspinatus tear, biceps tendinopathy   OPERATIVE REPORT:   I identified Jasmine Franco in the pre-operative holding area. Informed consent was obtained and the surgical site was marked. I reviewed the risks and benefits of the proposed surgical intervention and the patient (and/or patient's guardian) wished to proceed. An Exparel interscalene block was administered by the Anesthesia team. The patient was transferred to the operative suite and general anesthesia was administered. The patient was placed in the beach chair position with the head  of the bed elevated approximately 45 degrees. All down side pressure points were appropriately padded. Pre-op exam under anesthesia confirmed some stiffness and crepitus. Appropriate IV antibiotics were administered. Tranexamic acid was also administered after verifying that the patient had no contraindications. The extremity was then prepped and draped in standard fashion. A time out was performed confirming the correct extremity, correct patient, and correct procedure.   We used the standard deltopectoral incision from the coracoid to ~12cm distal. We found the cephalic vein and took it laterally.  We opened the deltopectoral interval widely and placed retractors under the CA ligament in the subacromial space and under the deltoid tendon at its insertion. We then abducted and internally rotated the arm and released the underlying bursa between these retractors, taking care not to damage the circumflex branch of the axillary nerve.   Next, we brought the arm back in adduction at slight forward flexion with external rotation. We opened the clavipectoral fascia lateral to the conjoint tendon. We gently palpated the axillary nerve and verified its position and continuity on both sides of the humerus with a Tug test. Note, this test was repeated multiple times during the procedure for nerve localization and confirmed to be intact at the end of the case. We then cauterized the anterior humeral circumflex ("Three sisters") vessels. The arm was then internally rotated, we cut the falciform ligament at approximately 1 cm of the upper portion of the pectoralis major insertion. Next we unroofed the bicipital groove. We proceeded with a soft tissue biceps tenodesis given the pathology of the tendon.  After opening the biceps tendon sheath all the way to the supraglenoid tubercle, we performed a biceps tenodesis with two #2 TiCron sutures to the upper border of the pectoralis major. The proximal portion of  the tendon was  excised.   Next, we performed a subscapularis peel. We released the inferior capsule from the humerus all the way to the posterior band of the inferior glenohumeral ligament. When this was complete we gently dislocated the shoulder up into the wound. We removed any osteophytes and made our cut with the appropriate inclination in 30 degrees of retroversion. The humeral canal was sequentially broached to the appropriate size.   We then turned our attention to the glenoid. The proximal humerus was retracted posteriorly. The anterior capsule was dissected free from the subscapularis. The anterior capsule was then excised, exposing the anterior glenoid. We then grasped the labrum and removed it circumferentially. During the glenoid exposure, the axillary nerve was protected the entire time.    A patient-specific guide was used to drill the central guidepin. The angled reamer was used to gently ream the glenoid in accordance with preoperative plan.  The full wedge glenoid trial was placed settled into place with the augment posterior and superior.  Standard instrumentation was carried out.  The baseplate with 6.5 mm central screw was placed.  This allowed for excellent compression of the baseplate to the glenoid with appropriate seating. Next the posterior compression screw was drilled and placed.  The superior, inferior, and anterior locking screws were then placed. The peripheral reamer was used to ensure appropriate glenosphere seating.  Next, the glenosphere was impacted into place, and central screw of the glenosphere was tightened.  We then turned our attention back to the humerus. The trial poly was place with eccentric tray, but the humerus could not be reduced. The humeral stem trial was impacted further by ~22mm. Calcar planer and rongeur were used to removed excess bone. This allowed for appropriate reduction of the humerus.  The shoulder was trialed and noted to have satisfactory stability, motion, and  deltoid tension with a standard +6 mm poly. Trial implants were removed.  The wound was thoroughly irrigated.  Three drill holes were placed about the bicipital groove with FiberWire suture passed through for subscapularis repair. The final humeral component with humeral tray and insert was then placed at 30 degrees of retroversion. The humerus was reduced and final confirmation of motion, tension, and stability were satisfactory. A Hemovac drain was placed. Subscapularis was repaired with arm in neutral rotation using the 3 previously passed FiberWire sutures.  We again verified the tension on the axillary nerve, appropriate range of motion, stability of the implant, and security of the subscapularis repair. We closed the deltopectoral interval a running, 0-Vicryl suture. The skin was closed with 2-0 Vicryl and 4-0 Monocryl with Dermabond. Xeroform and Honeycomb dressing was applied. A PolarCare unit and sling were placed. Patient was extubated, transferred to a stretcher bed and to the post antesthesia care unit in stable condition.   Of note, assistance from a PA was essential to performing the surgery.  PA was present for the entire surgery.  PA assisted with patient positioning, retraction, instrumentation, and wound closure. The surgery would have been more difficult and had longer operative time without PA assistance.    POSTOPERATIVE PLAN: The patient will be discharged home from the PACU. Operative arm to remain in sling at all times except RoM exercises and hygiene. Can perform pendulums, elbow/wrist/hand RoM exercises. Passive RoM allowed to 90 FF and 30 ER. ASA 325mg  x 6 weeks for DVT ppx. Plan for outpatient PT starting on POD #3-4. Patient to return to clinic in ~2 weeks for post-operative appointment.

## 2021-06-22 NOTE — Anesthesia Procedure Notes (Signed)
Anesthesia Regional Block: Interscalene brachial plexus block   Pre-Anesthetic Checklist: , timeout performed,  Correct Patient, Correct Site, Correct Laterality,  Correct Procedure, Correct Position, site marked,  Risks and benefits discussed,  Surgical consent,  Pre-op evaluation,  At surgeon's request and post-op pain management  Laterality: Upper and Left  Prep: chloraprep       Needles:  Injection technique: Single-shot  Needle Type: Stimiplex     Needle Length: 5cm  Needle Gauge: 22     Additional Needles:   Procedures:,,,, ultrasound used (permanent image in chart),,    Narrative:  Start time: 06/22/2021 7:20 AM End time: 06/22/2021 7:27 AM Injection made incrementally with aspirations every 5 mL.  Performed by: Personally  Anesthesiologist: Laurice Kimmons, Precious Haws, MD  Additional Notes: Patient consented for risk and benefits of nerve block including but not limited to nerve damage, failed block, bleeding and infection.  Patient voiced understanding.  Functioning IV was confirmed and monitors were applied.  Timeout done prior to procedure and prior to any sedation being given to the patient.  Patient confirmed procedure site prior to any sedation given to the patient.  A 17mm 22ga Stimuplex needle was used. Sterile prep,hand hygiene and sterile gloves were used.  Minimal sedation used for procedure.  No paresthesia endorsed by patient during the procedure.  Negative aspiration and negative test dose prior to incremental administration of local anesthetic. The patient tolerated the procedure well with no immediate complications.

## 2021-06-22 NOTE — Evaluation (Signed)
Physical Therapy Evaluation Patient Details Name: Jasmine Franco MRN: [id number] DOB: October 27, 1966 Today's Date: 06/22/2021   History of Present Illness  Pt admitted for L RSA.  Clinical Impression  Pt is a pleasant 55 year old female who was admitted for L TSR. Pt demonstrates all bed mobility/transfers/ambulation at baseline level. Doesn't need AD at this time. Discussed polar care and sling care along with ambulation. Stair training performed. Pt does not require any further PT needs at this time. Pt will be dc in house and does not require follow up. RN aware. Will dc current orders. Plans for OP PT at POD 3-4.     Follow Up Recommendations Outpatient PT    Equipment Recommendations  None recommended by PT    Recommendations for Other Services       Precautions / Restrictions Precautions Precautions: Fall;Shoulder Shoulder Interventions: Shoulder abduction pillow;At all times Precaution Booklet Issued: Yes (comment) Required Braces or Orthoses: Other Brace Other Brace: shoulder abduction brace Restrictions Weight Bearing Restrictions: Yes LUE Weight Bearing: Non weight bearing      Mobility  Bed Mobility               General bed mobility comments: NT, received in recliner    Transfers Overall transfer level: Needs assistance Equipment used: None Transfers: Sit to/from Stand Sit to Stand: Supervision         General transfer comment: slight difficulty from low surface. Once standing, upright posture noted, no dizziness noted  Ambulation/Gait Ambulation/Gait assistance: Min guard Gait Distance (Feet): 150 Feet Assistive device: None Gait Pattern/deviations: Step-through pattern     General Gait Details: ambulated in hallway with safe technique. 1 cue for spatial awareness. Reciprocal gait pattern performed  Stairs Stairs: Yes Stairs assistance: Min guard Stair Management: One rail Right Number of Stairs: 4 General stair comments: up/down with  step to gait pattern. Safe technique  Wheelchair Mobility    Modified Rankin (Stroke Patients Only)       Balance Overall balance assessment: Mild deficits observed, not formally tested                                           Pertinent Vitals/Pain Pain Assessment: Faces Faces Pain Scale: Hurts little more Pain Location: neck (from shoulder brace) Pain Descriptors / Indicators: Aching Pain Intervention(s): Limited activity within patient's tolerance;Ice applied    Home Living Family/patient expects to be discharged to:: Private residence Living Arrangements: Alone Available Help at Discharge: Family Type of Home: House Home Access: Stairs to enter Entrance Stairs-Rails:  (hold to side of wall) Entrance Stairs-Number of Steps: 1 Home Layout: One level Home Equipment: None      Prior Function Level of Independence: Independent         Comments: reports she was indep with all ADLs prior to admission. Does endorce 1 fall earlier this week     Hand Dominance        Extremity/Trunk Assessment   Upper Extremity Assessment Upper Extremity Assessment: Overall WFL for tasks assessed (L UE without sensation/NT)    Lower Extremity Assessment Lower Extremity Assessment: Overall WFL for tasks assessed       Communication   Communication: No difficulties  Cognition Arousal/Alertness: Awake/alert Behavior During Therapy: WFL for tasks assessed/performed Overall Cognitive Status: Within Functional Limits for tasks assessed  General Comments      Exercises Other Exercises Other Exercises: educated on hand/wrist/elbow ther-ex, however no sensation at this time   Assessment/Plan    PT Assessment All further PT needs can be met in the next venue of care  PT Problem List Decreased strength;Decreased mobility;Impaired sensation;Pain       PT Treatment Interventions      PT Goals (Current  goals can be found in the Care Plan section)  Acute Rehab PT Goals Patient Stated Goal: to go home PT Goal Formulation: All assessment and education complete, DC therapy Time For Goal Achievement: 06/22/21 Potential to Achieve Goals: Good    Frequency     Barriers to discharge        Co-evaluation               AM-PAC PT "6 Clicks" Mobility  Outcome Measure Help needed turning from your back to your side while in a flat bed without using bedrails?: A Little Help needed moving from lying on your back to sitting on the side of a flat bed without using bedrails?: A Little Help needed moving to and from a bed to a chair (including a wheelchair)?: A Little Help needed standing up from a chair using your arms (e.g., wheelchair or bedside chair)?: A Little Help needed to walk in hospital room?: A Little Help needed climbing 3-5 steps with a railing? : A Little 6 Click Score: 18    End of Session Equipment Utilized During Treatment: Gait belt Activity Tolerance: Patient tolerated treatment well Patient left: in chair;with family/visitor present Nurse Communication: Mobility status PT Visit Diagnosis: Muscle weakness (generalized) (M62.81);Pain Pain - Right/Left: Left Pain - part of body: Shoulder    Time: 2094-7096 PT Time Calculation (min) (ACUTE ONLY): 20 min   Charges:   PT Evaluation $PT Eval Low Complexity: 1 Low PT Treatments $Gait Training: 8-22 mins        Greggory Stallion, PT, DPT [phone]   Sahej Hauswirth 06/22/2021, 2:33 PM

## 2021-06-22 NOTE — Transfer of Care (Signed)
Immediate Anesthesia Transfer of Care Note  Patient: Jasmine Franco  Procedure(s) Performed: REVERSE SHOULDER ARTHROPLASTY, biceps tenodesis - RNFA needed (Left: Shoulder)  Patient Location: PACU  Anesthesia Type:General  Level of Consciousness: awake, alert  and oriented  Airway & Oxygen Therapy: Patient Spontanous Breathing and Patient connected to face mask oxygen  Post-op Assessment: Report given to RN and Post -op Vital signs reviewed and stable  Post vital signs: Reviewed and stable  Last Vitals:  Vitals Value Taken Time  BP 144/79 06/22/21 1111  Temp    Pulse 81 06/22/21 1114  Resp 3 06/22/21 1114  SpO2 100 % 06/22/21 1114  Vitals shown include unvalidated device data.  Last Pain:  Vitals:   06/22/21 0622  TempSrc: Temporal  PainSc: 0-No pain         Complications: No notable events documented.

## 2021-06-22 NOTE — Anesthesia Postprocedure Evaluation (Signed)
Anesthesia Post Note  Patient: Jasmine Franco  Procedure(s) Performed: REVERSE SHOULDER ARTHROPLASTY, biceps tenodesis - RNFA needed (Left: Shoulder)  Patient location during evaluation: PACU Anesthesia Type: General Level of consciousness: awake and alert Pain management: pain level controlled Vital Signs Assessment: post-procedure vital signs reviewed and stable Respiratory status: spontaneous breathing, nonlabored ventilation, respiratory function stable and patient connected to nasal cannula oxygen Cardiovascular status: blood pressure returned to baseline and stable Postop Assessment: no apparent nausea or vomiting Anesthetic complications: no   No notable events documented.   Last Vitals:  Vitals:   06/22/21 1155 06/22/21 1315  BP: 133/73 133/68  Pulse: 74 74  Resp: 16 16  Temp: (!) 36.2 C 36.7 C  SpO2: 99% 100%    Last Pain:  Vitals:   06/22/21 1315  TempSrc:   PainSc: 0-No pain                 Precious Haws Imya Mance

## 2021-06-22 NOTE — H&P (Signed)
Paper H&P to be scanned into permanent record. H&P reviewed. No significant changes noted.  

## 2021-06-23 ENCOUNTER — Encounter: Payer: Self-pay | Admitting: Orthopedic Surgery

## 2021-06-24 LAB — SURGICAL PATHOLOGY

## 2021-07-29 ENCOUNTER — Other Ambulatory Visit: Payer: Self-pay | Admitting: Orthopedic Surgery

## 2021-07-29 DIAGNOSIS — Z96612 Presence of left artificial shoulder joint: Secondary | ICD-10-CM

## 2021-07-29 DIAGNOSIS — M25511 Pain in right shoulder: Secondary | ICD-10-CM

## 2021-08-05 ENCOUNTER — Other Ambulatory Visit: Payer: Self-pay

## 2021-08-05 ENCOUNTER — Ambulatory Visit
Admission: RE | Admit: 2021-08-05 | Discharge: 2021-08-05 | Disposition: A | Discharge status: Home / Self Care | Payer: 59 | Source: Ambulatory Visit | Attending: Orthopedic Surgery | Admitting: Orthopedic Surgery

## 2021-08-05 DIAGNOSIS — M25511 Pain in right shoulder: Secondary | ICD-10-CM | POA: Diagnosis present

## 2021-08-05 DIAGNOSIS — Z96612 Presence of left artificial shoulder joint: Secondary | ICD-10-CM

## 2021-08-25 ENCOUNTER — Other Ambulatory Visit: Payer: Self-pay | Admitting: Internal Medicine

## 2021-09-16 ENCOUNTER — Other Ambulatory Visit: Payer: Self-pay | Admitting: Orthopedic Surgery

## 2021-09-25 ENCOUNTER — Other Ambulatory Visit: Payer: Self-pay

## 2021-09-25 ENCOUNTER — Other Ambulatory Visit
Admission: RE | Admit: 2021-09-25 | Discharge: 2021-09-25 | Disposition: A | Discharge status: Home / Self Care | Payer: 59 | Source: Ambulatory Visit | Attending: Orthopedic Surgery | Admitting: Orthopedic Surgery

## 2021-09-25 HISTORY — DX: Personal history of urinary calculi: Z87.442

## 2021-09-25 NOTE — Patient Instructions (Signed)
Your procedure is scheduled on: Monday October 05, 2021. Report to Day Surgery inside [facility] 2nd floor stop by admissions desk first before getting on elevator. To find out your arrival time please call [phone] between 1PM - 3PM on Friday October 02, 2021.  Remember: Instructions that are not followed completely may result in serious medical risk,  up to and including death, or upon the discretion of your surgeon and anesthesiologist your  surgery may need to be rescheduled.     _X__ 1. Do not eat food after midnight the night before your procedure.                 No chewing gum or hard candies. You may drink clear liquids up to 2 hours                 before you are scheduled to arrive for your surgery- DO not drink clear                 liquids within 2 hours of the start of your surgery.                 Clear Liquids include:  water, apple juice without pulp, clear Gatorade, G2 or                  Gatorade Zero (avoid Red/Purple/Blue), Black Coffee or Tea (Do not add                 anything to coffee or tea).  __X__2.   Complete the "Ensure Clear Pre-surgery Clear Carbohydrate Drink" provided to you, 2 hours before arrival. **If you are diabetic you will be provided with an alternative drink, Gatorade Zero or G2.  __X__3.  On the morning of surgery brush your teeth with toothpaste and water, you                may rinse your mouth with mouthwash if you wish.  Do not swallow any toothpaste of mouthwash.     _X__ 4.  No Alcohol for 24 hours before or after surgery.   _X__ 5.  Do Not Smoke or use e-cigarettes For 24 Hours Prior to Your Surgery.                 Do not use any chewable tobacco products for at least 6 hours prior to                 Surgery.  _X__  6.  Do not use any recreational drugs (marijuana, cocaine, heroin, ecstasy, MDMA or other)                For at least one week prior to your surgery.  Combination of these drugs with  anesthesia                May have life threatening results.   __X__  7.  Notify your doctor if there is any change in your medical condition      (cold, fever, infections).     Do not wear jewelry, make-up, hairpins, clips or nail polish. Do not wear lotions, powders, perfumes or deodorant. Do not shave 48 hours prior to surgery. Men may shave face and neck. Do not bring valuables to the hospital.    [facility] is not responsible for any belongings or valuables.  Contacts, dentures or bridgework may not be worn into surgery. Leave your suitcase in the car. After surgery it  may be brought to your room. For patients admitted to the hospital, discharge time is determined by your treatment team.   Patients discharged the day of surgery will not be allowed to drive home.   Make arrangements for someone to be with you for the first 24 hours of your Same Day Discharge.   __X__ Take these medicines the morning of surgery with A SIP OF WATER:    1. None   2.   3.   4.  5.  6.  ____ Fleet Enema (as directed)   __X__ Use CHG Soap (or wipes) as directed  ____ Use Benzoyl Peroxide Gel as instructed  ____ Use inhalers on the day of surgery  ____ Stop metformin 2 days prior to surgery    ____ Take 1/2 of usual insulin dose the night before surgery. No insulin the morning          of surgery.   ____ Call your PCP, cardiologist, or Pulmonologist if taking Coumadin/Plavix/aspirin and ask when to stop before your surgery.   __X__ One Week prior to surgery- Stop Anti-inflammatories such as Ibuprofen, Aleve, Advil, Motrin, meloxicam (MOBIC), diclofenac, etodolac, ketorolac, Toradol, Daypro, piroxicam, Goody's or BC powders. OK TO USE TYLENOL IF NEEDED   __X__ Stop supplements until after surgery.    ____ Bring C-Pap to the hospital.    If you have any questions regarding your pre-procedure instructions,  Please call Pre-admit Testing at [phone].

## 2021-10-01 ENCOUNTER — Other Ambulatory Visit: Payer: 59

## 2021-10-05 ENCOUNTER — Ambulatory Visit
Admission: RE | Admit: 2021-10-05 | Discharge: 2021-10-05 | Disposition: A | Discharge status: Home / Self Care | Payer: 59 | Source: Ambulatory Visit | Attending: Orthopedic Surgery | Admitting: Orthopedic Surgery

## 2021-10-05 ENCOUNTER — Encounter: Payer: Self-pay | Admitting: Orthopedic Surgery

## 2021-10-05 ENCOUNTER — Encounter
Admission: RE | Disposition: A | Discharge status: Home / Self Care | Payer: Self-pay | Source: Ambulatory Visit | Attending: Orthopedic Surgery

## 2021-10-05 ENCOUNTER — Ambulatory Visit: Payer: 59 | Admitting: Anesthesiology

## 2021-10-05 ENCOUNTER — Ambulatory Visit: Payer: 59

## 2021-10-05 DIAGNOSIS — Z91048 Other nonmedicinal substance allergy status: Secondary | ICD-10-CM | POA: Insufficient documentation

## 2021-10-05 DIAGNOSIS — Z833 Family history of diabetes mellitus: Secondary | ICD-10-CM | POA: Insufficient documentation

## 2021-10-05 DIAGNOSIS — M75121 Complete rotator cuff tear or rupture of right shoulder, not specified as traumatic: Secondary | ICD-10-CM | POA: Diagnosis not present

## 2021-10-05 DIAGNOSIS — M7581 Other shoulder lesions, right shoulder: Secondary | ICD-10-CM | POA: Diagnosis not present

## 2021-10-05 DIAGNOSIS — S43431A Superior glenoid labrum lesion of right shoulder, initial encounter: Secondary | ICD-10-CM | POA: Insufficient documentation

## 2021-10-05 DIAGNOSIS — M7541 Impingement syndrome of right shoulder: Secondary | ICD-10-CM | POA: Diagnosis not present

## 2021-10-05 DIAGNOSIS — X58XXXA Exposure to other specified factors, initial encounter: Secondary | ICD-10-CM | POA: Diagnosis not present

## 2021-10-05 DIAGNOSIS — M75101 Unspecified rotator cuff tear or rupture of right shoulder, not specified as traumatic: Secondary | ICD-10-CM | POA: Diagnosis present

## 2021-10-05 DIAGNOSIS — Z79899 Other long term (current) drug therapy: Secondary | ICD-10-CM | POA: Insufficient documentation

## 2021-10-05 DIAGNOSIS — Z419 Encounter for procedure for purposes other than remedying health state, unspecified: Secondary | ICD-10-CM

## 2021-10-05 HISTORY — PX: SHOULDER ARTHROSCOPY WITH SUBACROMIAL DECOMPRESSION AND OPEN ROTATOR C: SHX5688

## 2021-10-05 SURGERY — SHOULDER ARTHROSCOPY WITH SUBACROMIAL DECOMPRESSION AND OPEN ROTATOR CUFF REPAIR, OPEN BICEPS TENDON REPAIR
Anesthesia: General | Site: Shoulder | Laterality: Right

## 2021-10-05 MED ORDER — FAMOTIDINE 20 MG PO TABS
ORAL_TABLET | ORAL | Status: AC
  Administered 2021-10-05: 20 mg via ORAL
  Filled 2021-10-05: qty 1

## 2021-10-05 MED ORDER — CEFAZOLIN SODIUM-DEXTROSE 2-4 GM/100ML-% IV SOLN
INTRAVENOUS | Status: AC
  Filled 2021-10-05: qty 100

## 2021-10-05 MED ORDER — DEXAMETHASONE SODIUM PHOSPHATE 10 MG/ML IJ SOLN
INTRAMUSCULAR | Status: DC | PRN
  Administered 2021-10-05: 10 mg via INTRAVENOUS

## 2021-10-05 MED ORDER — ROCURONIUM BROMIDE 100 MG/10ML IV SOLN
INTRAVENOUS | Status: DC | PRN
  Administered 2021-10-05: 60 mg via INTRAVENOUS

## 2021-10-05 MED ORDER — ROCURONIUM BROMIDE 10 MG/ML (PF) SYRINGE
PREFILLED_SYRINGE | INTRAVENOUS | Status: AC
  Filled 2021-10-05: qty 10

## 2021-10-05 MED ORDER — FAMOTIDINE 20 MG PO TABS: 20.0000 mg | ORAL_TABLET | Freq: Once | ORAL | Status: AC

## 2021-10-05 MED ORDER — ONDANSETRON HCL 4 MG/2ML IJ SOLN: 4.0000 mg | Freq: Once | INTRAMUSCULAR | Status: DC | PRN

## 2021-10-05 MED ORDER — FENTANYL CITRATE (PF) 100 MCG/2ML IJ SOLN: 25.0000 ug | INTRAMUSCULAR | Status: DC | PRN

## 2021-10-05 MED ORDER — LACTATED RINGERS IV SOLN
INTRAVENOUS | Status: DC | PRN
  Administered 2021-10-05: 12000 mL

## 2021-10-05 MED ORDER — ASPIRIN EC 325 MG PO TBEC: 325.0000 mg | DELAYED_RELEASE_TABLET | Freq: Every day | ORAL | 0 refills | Status: AC

## 2021-10-05 MED ORDER — LIDOCAINE HCL (CARDIAC) PF 100 MG/5ML IV SOSY
PREFILLED_SYRINGE | INTRAVENOUS | Status: DC | PRN
  Administered 2021-10-05: 50 mg via INTRAVENOUS

## 2021-10-05 MED ORDER — ONDANSETRON HCL 4 MG/2ML IJ SOLN
INTRAMUSCULAR | Status: AC
  Filled 2021-10-05: qty 2

## 2021-10-05 MED ORDER — ACETAMINOPHEN 500 MG PO TABS: 1000.0000 mg | ORAL_TABLET | Freq: Three times a day (TID) | ORAL | 2 refills | Status: AC

## 2021-10-05 MED ORDER — PROPOFOL 10 MG/ML IV BOLUS
INTRAVENOUS | Status: AC
  Filled 2021-10-05: qty 20

## 2021-10-05 MED ORDER — OXYCODONE HCL 5 MG PO TABS
ORAL_TABLET | ORAL | Status: AC
  Administered 2021-10-05: 5 mg via ORAL
  Filled 2021-10-05: qty 1

## 2021-10-05 MED ORDER — FENTANYL CITRATE PF 50 MCG/ML IJ SOSY: 50.0000 ug | PREFILLED_SYRINGE | Freq: Once | INTRAMUSCULAR | Status: AC

## 2021-10-05 MED ORDER — BUPIVACAINE HCL (PF) 0.5 % IJ SOLN
INTRAMUSCULAR | Status: AC
  Filled 2021-10-05: qty 10

## 2021-10-05 MED ORDER — DEXAMETHASONE SODIUM PHOSPHATE 10 MG/ML IJ SOLN
INTRAMUSCULAR | Status: AC
  Filled 2021-10-05: qty 1

## 2021-10-05 MED ORDER — LACTATED RINGERS IV SOLN: INTRAVENOUS | Status: DC

## 2021-10-05 MED ORDER — FENTANYL CITRATE (PF) 100 MCG/2ML IJ SOLN
INTRAMUSCULAR | Status: AC
  Filled 2021-10-05: qty 2

## 2021-10-05 MED ORDER — ONDANSETRON HCL 4 MG/2ML IJ SOLN
INTRAMUSCULAR | Status: DC | PRN
  Administered 2021-10-05: 4 mg via INTRAVENOUS

## 2021-10-05 MED ORDER — GLYCOPYRROLATE 0.2 MG/ML IJ SOLN
INTRAMUSCULAR | Status: DC | PRN
  Administered 2021-10-05: .2 mg via INTRAVENOUS

## 2021-10-05 MED ORDER — 0.9 % SODIUM CHLORIDE (POUR BTL) OPTIME
TOPICAL | Status: DC | PRN
  Administered 2021-10-05: 500 mL

## 2021-10-05 MED ORDER — FENTANYL CITRATE (PF) 100 MCG/2ML IJ SOLN
INTRAMUSCULAR | Status: DC | PRN
  Administered 2021-10-05: 50 ug via INTRAVENOUS

## 2021-10-05 MED ORDER — GLYCOPYRROLATE 0.2 MG/ML IJ SOLN
INTRAMUSCULAR | Status: AC
  Filled 2021-10-05: qty 1

## 2021-10-05 MED ORDER — OXYCODONE HCL 5 MG PO TABS: 5.0000 mg | ORAL_TABLET | Freq: Once | ORAL | Status: AC | PRN

## 2021-10-05 MED ORDER — MIDAZOLAM HCL 2 MG/2ML IJ SOLN: 1.0000 mg | Freq: Once | INTRAMUSCULAR | Status: AC

## 2021-10-05 MED ORDER — ORAL CARE MOUTH RINSE: 15.0000 mL | Freq: Once | OROMUCOSAL | Status: AC

## 2021-10-05 MED ORDER — FENTANYL CITRATE PF 50 MCG/ML IJ SOSY
PREFILLED_SYRINGE | INTRAMUSCULAR | Status: AC
  Administered 2021-10-05: 50 ug via INTRAVENOUS
  Filled 2021-10-05: qty 1

## 2021-10-05 MED ORDER — PROPOFOL 10 MG/ML IV BOLUS
INTRAVENOUS | Status: DC | PRN
  Administered 2021-10-05: 150 mg via INTRAVENOUS

## 2021-10-05 MED ORDER — MIDAZOLAM HCL 2 MG/2ML IJ SOLN
INTRAMUSCULAR | Status: AC
  Administered 2021-10-05: 1 mg via INTRAVENOUS
  Filled 2021-10-05: qty 2

## 2021-10-05 MED ORDER — BUPIVACAINE HCL (PF) 0.5 % IJ SOLN
INTRAMUSCULAR | Status: DC | PRN
  Administered 2021-10-05: 10 mL via PERINEURAL

## 2021-10-05 MED ORDER — ACETAMINOPHEN 10 MG/ML IV SOLN: 1000.0000 mg | Freq: Once | INTRAVENOUS | Status: DC | PRN

## 2021-10-05 MED ORDER — OXYCODONE HCL 5 MG PO TABS: 5.0000 mg | ORAL_TABLET | ORAL | 0 refills | Status: AC | PRN

## 2021-10-05 MED ORDER — CHLORHEXIDINE GLUCONATE 0.12 % MT SOLN
OROMUCOSAL | Status: AC
  Administered 2021-10-05: 15 mL via OROMUCOSAL
  Filled 2021-10-05: qty 15

## 2021-10-05 MED ORDER — ONDANSETRON 4 MG PO TBDP: 4.0000 mg | ORAL_TABLET | Freq: Three times a day (TID) | ORAL | 0 refills | Status: DC | PRN

## 2021-10-05 MED ORDER — FAMOTIDINE 20 MG PO TABS
ORAL_TABLET | ORAL | Status: AC
  Filled 2021-10-05: qty 1

## 2021-10-05 MED ORDER — OXYCODONE HCL 5 MG/5ML PO SOLN: 5.0000 mg | Freq: Once | ORAL | Status: AC | PRN

## 2021-10-05 MED ORDER — CHLORHEXIDINE GLUCONATE 0.12 % MT SOLN: 15.0000 mL | Freq: Once | OROMUCOSAL | Status: AC

## 2021-10-05 MED ORDER — CEFAZOLIN SODIUM-DEXTROSE 2-4 GM/100ML-% IV SOLN
2.0000 g | INTRAVENOUS | Status: AC
  Administered 2021-10-05: 2 g via INTRAVENOUS

## 2021-10-05 MED ORDER — BUPIVACAINE LIPOSOME 1.3 % IJ SUSP
INTRAMUSCULAR | Status: DC | PRN
  Administered 2021-10-05: 10 mL via PERINEURAL

## 2021-10-05 MED ORDER — SODIUM CHLORIDE 0.9 % IV SOLN
INTRAVENOUS | Status: DC | PRN
  Administered 2021-10-05: 30 ug/min via INTRAVENOUS

## 2021-10-05 MED ORDER — BUPIVACAINE LIPOSOME 1.3 % IJ SUSP
INTRAMUSCULAR | Status: AC
  Filled 2021-10-05: qty 20

## 2021-10-05 MED ORDER — PHENYLEPHRINE HCL (PRESSORS) 10 MG/ML IV SOLN
INTRAVENOUS | Status: DC | PRN
  Administered 2021-10-05 (×2): 100 ug via INTRAVENOUS

## 2021-10-05 MED ORDER — RINGERS IRRIGATION IR SOLN
Status: DC | PRN
  Administered 2021-10-05: 12000 mL

## 2021-10-05 SURGICAL SUPPLY — 68 items
ADAPTER IRRIG TUBE 2 SPIKE SOL (ADAPTER) ×4 IMPLANT
ADPR TBG 2 SPK PMP STRL ASCP (ADAPTER) ×2
ANCH SUT 2 SWLK 19.1 CLS EYLT (Anchor) ×2 IMPLANT
ANCH SUT 2X2.3 TAPE (Anchor) ×2 IMPLANT
ANCH SUT SHRT 12.5 CANN EYLT (Anchor) ×1 IMPLANT
ANCH SUT SWLK 19.1X4.75 (Anchor) ×1 IMPLANT
ANCHOR 2.3 SP SGL 1.2 XBRAID (Anchor) ×4 IMPLANT
ANCHOR SUT BIO SW 4.75X19.1 (Anchor) ×2 IMPLANT
ANCHOR SUT BIOCOMP LK 2.9X12.5 (Anchor) ×2 IMPLANT
ANCHOR SWIVELOCK BIO 4.75X19.1 (Anchor) ×4 IMPLANT
APL PRP STRL LF DISP 70% ISPRP (MISCELLANEOUS) ×1
BLADE SHAVER 4.5X7 STR FR (MISCELLANEOUS) ×2 IMPLANT
BNDG ADH 2 X3.75 FABRIC TAN LF (GAUZE/BANDAGES/DRESSINGS) ×2 IMPLANT
BNDG ADH XL 3.75X2 STRCH LF (GAUZE/BANDAGES/DRESSINGS) ×1
BUR BR 5.5 WIDE MOUTH (BURR) ×2 IMPLANT
CANNULA TWIST IN 8.25X9CM (CANNULA) ×2 IMPLANT
CHLORAPREP W/TINT 26 (MISCELLANEOUS) ×2 IMPLANT
COOLER POLAR GLACIER W/PUMP (MISCELLANEOUS) ×2 IMPLANT
DRAPE 3/4 80X56 (DRAPES) ×2 IMPLANT
DRAPE IMP U-DRAPE 54X76 (DRAPES) ×4 IMPLANT
DRAPE INCISE IOBAN 66X45 STRL (DRAPES) ×2 IMPLANT
DRAPE U-SHAPE 47X51 STRL (DRAPES) ×4 IMPLANT
DRSG TEGADERM 4X4.75 (GAUZE/BANDAGES/DRESSINGS) ×6 IMPLANT
ELECT REM PT RETURN 9FT ADLT (ELECTROSURGICAL) ×2
ELECTRODE REM PT RTRN 9FT ADLT (ELECTROSURGICAL) ×1 IMPLANT
GAUZE SPONGE 4X4 12PLY STRL (GAUZE/BANDAGES/DRESSINGS) ×2 IMPLANT
GAUZE XEROFORM 1X8 LF (GAUZE/BANDAGES/DRESSINGS) ×2 IMPLANT
GLOVE SRG 8 PF TXTR STRL LF DI (GLOVE) ×2 IMPLANT
GLOVE SURG ENC MOIS LTX SZ7.5 (GLOVE) ×2 IMPLANT
GLOVE SURG ORTHO LTX SZ8 (GLOVE) ×2 IMPLANT
GLOVE SURG SYN 8.0 (GLOVE) ×2 IMPLANT
GLOVE SURG UNDER POLY LF SZ8 (GLOVE) ×4
GOWN STRL REUS W/ TWL LRG LVL3 (GOWN DISPOSABLE) ×2 IMPLANT
GOWN STRL REUS W/TWL LRG LVL3 (GOWN DISPOSABLE) ×4
GOWN STRL REUS W/TWL XL LVL4 (GOWN DISPOSABLE) ×2 IMPLANT
IV LACTATED RINGER IRRG 3000ML (IV SOLUTION) ×8
IV LR IRRIG 3000ML ARTHROMATIC (IV SOLUTION) ×4 IMPLANT
KIT INSERTION 2.9 PUSHLOCK (KITS) ×2 IMPLANT
KIT STABILIZATION SHOULDER (MISCELLANEOUS) ×2 IMPLANT
KIT TURNOVER KIT A (KITS) ×2 IMPLANT
MANIFOLD NEPTUNE II (INSTRUMENTS) ×4 IMPLANT
MASK FACE SPIDER DISP (MASK) ×2 IMPLANT
MAT ABSORB  FLUID 56X50 GRAY (MISCELLANEOUS) ×4
MAT ABSORB FLUID 56X50 GRAY (MISCELLANEOUS) ×2 IMPLANT
NDL SAFETY ECLIPSE 18X1.5 (NEEDLE) ×1 IMPLANT
NEEDLE HYPO 18GX1.5 SHARP (NEEDLE) ×2
PACK ARTHROSCOPY SHOULDER (MISCELLANEOUS) ×2 IMPLANT
PAD ARMBOARD 7.5X6 YLW CONV (MISCELLANEOUS) ×2 IMPLANT
PAD WRAPON POLAR SHDR XLG (MISCELLANEOUS) ×1 IMPLANT
PASSER SUT FIRSTPASS SELF (INSTRUMENTS) ×4 IMPLANT
PASSER SUT SWIFTSTITCH HIP CRT (INSTRUMENTS) ×2 IMPLANT
PENCIL SMOKE EVACUATOR (MISCELLANEOUS) ×2 IMPLANT
SHAVER BLADE BONE CUTTER  5.5 (BLADE) ×2
SHAVER BLADE BONE CUTTER 5.5 (BLADE) ×1 IMPLANT
SLING ULTRA II M (MISCELLANEOUS) ×2 IMPLANT
SPONGE T-LAP 18X18 ~~LOC~~+RFID (SPONGE) ×2 IMPLANT
STRAP SAFETY 5IN WIDE (MISCELLANEOUS) ×2 IMPLANT
SUT ETHILON 3-0 (SUTURE) ×2 IMPLANT
SUT PROLENE 0 CT 2 (SUTURE) IMPLANT
SUTURE TAPE FIBERLINK 1.3 LOOP (SUTURE) ×1 IMPLANT
SUTURETAPE FIBERLINK 1.3 LOOP (SUTURE) ×2
TAPE CLOTH 3X10 WHT NS LF (GAUZE/BANDAGES/DRESSINGS) ×2 IMPLANT
TAPE MICROFOAM 4IN (TAPE) ×2 IMPLANT
TUBING INFLOW SET DBFLO PUMP (TUBING) ×2 IMPLANT
TUBING OUTFLOW SET DBLFO PUMP (TUBING) ×2 IMPLANT
WAND WEREWOLF FLOW 90D (MISCELLANEOUS) ×2 IMPLANT
WATER STERILE IRR 500ML POUR (IV SOLUTION) ×2 IMPLANT
WRAPON POLAR PAD SHDR XLG (MISCELLANEOUS) ×2

## 2021-10-05 NOTE — H&P (Signed)
Paper H&P to be scanned into permanent record. H&P reviewed. No significant changes noted.  

## 2021-10-05 NOTE — Anesthesia Preprocedure Evaluation (Signed)
Anesthesia Evaluation  Patient identified by MRN, date of birth, ID band Patient awake    Reviewed: Allergy & Precautions, NPO status , Patient's Chart, lab work & pertinent test results  History of Anesthesia Complications Negative for: history of anesthetic complications  Airway Mallampati: III  TM Distance: <3 FB Neck ROM: full    Dental  (+) Chipped   Pulmonary neg pulmonary ROS, neg shortness of breath, neg COPD,    Pulmonary exam normal breath sounds clear to auscultation       Cardiovascular Exercise Tolerance: Good (-) angina(-) Past MI negative cardio ROS Normal cardiovascular exam Rhythm:Regular Rate:Normal     Neuro/Psych PSYCHIATRIC DISORDERS Depression negative neurological ROS     GI/Hepatic negative GI ROS, Neg liver ROS,   Endo/Other  negative endocrine ROS  Renal/GU Renal disease     Musculoskeletal   Abdominal   Peds  Hematology negative hematology ROS (+)   Anesthesia Other Findings Past Medical History: No date: Cancer ([place]) No date: Dyspnea  Past Surgical History: No date: CHOLECYSTECTOMY  BMI    Body Mass Index: 41.74 kg/m      Reproductive/Obstetrics negative OB ROS                             Anesthesia Physical  Anesthesia Plan  ASA: 3  Anesthesia Plan: General   Post-op Pain Management: GA combined w/ Regional for post-op pain   Induction: Intravenous  PONV Risk Score and Plan: 3 and Ondansetron, Dexamethasone, Midazolam and Treatment may vary due to age or medical condition  Airway Management Planned: Oral ETT  Additional Equipment: None  Intra-op Plan:   Post-operative Plan: Extubation in OR  Informed Consent: I have reviewed the patients History and Physical, chart, labs and discussed the procedure including the risks, benefits and alternatives for the proposed anesthesia with the patient or authorized representative who has indicated  his/her understanding and acceptance.     Dental Advisory Given  Plan Discussed with: Anesthesiologist, CRNA and Surgeon  Anesthesia Plan Comments: (Discussed risks of anesthesia with patient, including PONV, sore throat, lip/dental damage. Rare risks discussed as well, such as cardiorespiratory and neurological sequelae, and allergic reactions. Patient understands. Discussed r/b/a of interscalene block, including elective nature. Patient did not have an issue last time with her block, does not remember how long it lasted. Risks discussed: - Rare: bleeding, infection, nerve damage - shortness of breath from hemidiaphragmatic paralysis - unilateral horner's syndrome - poor/non-working blocks Patient understands and agrees. )        Anesthesia Quick Evaluation

## 2021-10-05 NOTE — Op Note (Signed)
SURGERY DATE: 10/05/2021   PRE-OP DIAGNOSIS:  1. Right subacromial impingement 2. Right biceps tendinopathy 3. Right rotator cuff tear   POST-OP DIAGNOSIS: 1. Right subacromial impingement 2. Right biceps tendinopathy 3. Right rotator cuff tear   PROCEDURES:  1. Right arthroscopic rotator cuff repair 2. Right arthroscopic biceps tenodesis 3. Right arthroscopic subacromial decompression 4. Right arthroscopic extensive debridement of shoulder (glenohumeral and subacromial spaces)   SURGEON: Cato Mulligan, MD   ASSISTANT: Anitra Lauth, PA   ANESTHESIA: Gen with Exparel interscalene block   ESTIMATED BLOOD LOSS: 5cc   DRAINS:  none   TOTAL IV FLUIDS: per anesthesia      SPECIMENS: none   IMPLANTS:  - Arthrex 2.42mm PushLock x 1 - Arthrex 4.38mm SwiveLock x 3 - Iconix SPEED double loaded with 1.2 and 2.68mm tape x 1     OPERATIVE FINDINGS:  Examination under anesthesia: A careful examination under anesthesia was performed.  Passive range of motion was: FF: 150; ER at side: 60; ER in abduction: 90; IR in abduction: 45.  Anterior load shift: NT.  Posterior load shift: NT.  Sulcus in neutral: NT.  Sulcus in ER: NT.     Intra-operative findings: A thorough arthroscopic examination of the shoulder was performed.  The findings are: 1. Biceps tendon: tendinopathy with significant erythema  2. Superior labrum: Type II SLAP tear 3. Posterior labrum and capsule: normal 4. Inferior capsule and inferior recess: normal 5. Glenoid cartilage surface: Normal 6. Supraspinatus attachment: full-thickness tear of the supraspinatus 7. Posterior rotator cuff attachment: normal 8. Humeral head articular cartilage: normal 9. Rotator interval: significant synovitis 10: Subscapularis tendon: attachment intact 11. Anterior labrum: Mildly degenerative 12. IGHL: normal   OPERATIVE REPORT:    Indications for procedure:  Jasmine Franco is a 55 y.o. female with over 1 year of right shoulder  pain. She has had difficulty with overhead motion with sensations of weakness. Clinical exam and MRI were suggestive of full-thickness rotator cuff tear, biceps tendinopathy, and subacromial impingement. After discussion of risks, benefits, and alternatives to surgery, the patient elected to proceed.    Procedure in detail:   I identified ANNSLEY AKKERMAN in the pre-operative holding area.  I marked the operative shoulder with my initials. I reviewed the risks and benefits of the proposed surgical intervention, and the patient wished to proceed.  Anesthesia was then performed with an Exparel interscalene block.  The patient was transferred to the operative suite and placed in the beach chair position.     Appropriate IV antibiotics were administered prior to incision. The operative upper extremity was then prepped and draped in standard fashion. A time out was performed confirming the correct extremity, correct patient, and correct procedure.    I then created a standard posterior portal with an 11 blade. The glenohumeral joint was easily entered with a blunt trocar and the arthroscope introduced. The findings of diagnostic arthroscopy are described above. I debrided degenerative tissue including the synovitic tissue about the rotator interval and anterior and superior labrum. I then coagulated the inflamed synovium to obtain hemostasis and reduce the risk of post-operative swelling using an Arthrocare radiofrequency device.   I then turned my attention to the arthroscopic biceps tenodesis. The Loop n Tack technique was used to pass a FiberTape through the biceps in a locked fashion adjacent to the biceps anchor.  A hole for a 2.9 mm Arthrex PushLock was drilled in the bicipital groove just superior to the subscapularis tendon insertion.  The biceps tendon was then cut and the biceps anchor complex was debrided down to a stable base on the superior labrum.  The FiberTape was loaded onto the PushLock anchor  and impacted into place into the previously drilled hole in the bicipital groove.  This appropriately secured the biceps into the bicipital groove and took it off of tension.   Next, the arthroscope was then introduced into the subacromial space. A direct lateral portal was created with an 11-blade after spinal needle localization. An extensive subacromial bursectomy was performed using a combination of the shaver and Arthrocare wand. The entire acromial undersurface was exposed and the CA ligament was subperiosteally elevated to expose the anterior acromial hook. A burr was used to create a flat anterior and lateral aspect of the acromion, converting it from a Type 2 to a Type 1 acromion. Care was made to keep the deltoid fascia intact.   Next I created an accessory posterolateral portal to assist with visualization and instrumentation.  I debrided the poor quality edges of the supraspinatus tendon.  This was a U-shaped tear of the supraspinatus.  I prepared the footprint using a burr to expose bleeding bone.    I then percutaneously placed 1 Iconix SPEED medial row anchor along the anterior portion of the tear at the articular margin. Another SPEED anchor was placed along the posterior portion of the tear at the articular margin.  However, this anchor pulled out of the bone.  The sutures were shuttled through an Arthrex 4.75 mm SwiveLock anchor and this was impacted into the same hole.  This did achieve excellent fixation.  I then shuttled all 8 strands of tape through the rotator cuff just lateral to the musculotendinous junction using a FirstPass suture passer spanning the anterior to posterior extent of the tear. The posterior strands of each suture were passed through an [employer] anchor.  This was placed approximately 2 cm distal to the lateral edge of the footprint in line with the posterior aspect of the tear with appropriate tensioning of each suture prior to final fixation.  Similarly, the  anterior strands of each suture were passed through another SwiveLock anchor along the anterior margin of the tear.  This construct allowed for excellent reapproximation of the rotator cuff to its native footprint without undue tension.  Appropriate compression was achieved.  The repair was stable to external and internal rotation.   Fluid was evacuated from the shoulder, and the portals were closed with 3-0 Nylon. Xeroform was applied to the portals. A sterile dressing was applied, followed by a Polar Care sleeve and a SlingShot shoulder immobilizer/sling. The patient was awakened from anesthesia without difficulty and was transferred to the PACU in stable condition.    Of note, assistance from a PA was essential to performing the surgery.  PA was present for the entire surgery.  PA assisted with patient positioning, retraction, instrumentation, and wound closure. The surgery would have been more difficult and had longer operative time without PA assistance.   COMPLICATIONS: none   DISPOSITION: plan for discharge home after recovery in PACU     POSTOPERATIVE PLAN: Remain in sling (except hygiene and elbow/wrist/hand RoM exercises as instructed by PT) x 6 weeks and NWB for this time. PT to begin 3-4 days after surgery.  Large rotator cuff repair rehab protocol. ASA 325mg  daily x 2 weeks for DVT ppx.

## 2021-10-05 NOTE — Discharge Instructions (Addendum)
Post-Op Instructions - Rotator Cuff Repair  1. Bracing: You will wear a shoulder immobilizer or sling for 6 weeks.   2. Driving: No driving for 3 weeks post-op. When driving, do not wear the immobilizer. Ideally, we recommend no driving for 6 weeks while sling is in place as one arm will be immobilized.   3. Activity: No active lifting for 2 months. Wrist, hand, and elbow motion only. Avoid lifting the upper arm away from the body except for hygiene. You are permitted to bend and straighten the elbow passively only (no active elbow motion). You may use your hand and wrist for typing, writing, and managing utensils (cutting food). Do not lift more than a coffee cup for 8 weeks.  When sleeping or resting, inclined positions (recliner chair or wedge pillow) and a pillow under the forearm for support may provide better comfort for up to 4 weeks.  Avoid long distance travel for 4 weeks.  Return to normal activities after rotator cuff repair repair normally takes 6 months on average. If rehab goes very well, may be able to do most activities at 4 months, except overhead or contact sports.  4. Physical Therapy: Begins 3-4 days after surgery, and proceed 1 time per week for the first 6 weeks, then 1-2 times per week from weeks 6-20 post-op.  5. Medications:  - You will be provided a prescription for narcotic pain medicine. After surgery, take 1-2 narcotic tablets every 4 hours if needed for severe pain.  - A prescription for anti-nausea medication will be provided in case the narcotic medicine causes nausea - take 1 tablet every 6 hours only if nauseated.   - Take tylenol 1000 mg (2 Extra Strength tablets or 3 regular strength) every 8 hours for pain.  May decrease or stop tylenol 5 days after surgery if you are having minimal pain. - Take ASA 325mg/day x 2 weeks to help prevent DVTs/PEs (blood clots).  - DO NOT take ANY nonsteroidal anti-inflammatory pain medications (Advil, Motrin, Ibuprofen, Aleve,  Naproxen, or Naprosyn). These medicines can inhibit healing of your shoulder repair.    If you are taking prescription medication for anxiety, depression, insomnia, muscle spasm, chronic pain, or for attention deficit disorder, you are advised that you are at a higher risk of adverse effects with use of narcotics post-op, including narcotic addiction/dependence, depressed breathing, death. If you use non-prescribed substances: alcohol, marijuana, cocaine, heroin, methamphetamines, etc., you are at a higher risk of adverse effects with use of narcotics post-op, including narcotic addiction/dependence, depressed breathing, death. You are advised that taking > 50 morphine milligram equivalents (MME) of narcotic pain medication per day results in twice the risk of overdose or death. For your prescription provided: oxycodone 5 mg - taking more than 6 tablets per day would result in > 50 morphine milligram equivalents (MME) of narcotic pain medication. Be advised that we will prescribe narcotics short-term, for acute post-operative pain only - 3 weeks for major operations such as shoulder repair/reconstruction surgeries.     6. Post-Op Appointment:  Your first post-op appointment will be 10-14 days post-op.  7. Work or School: For most, but not all procedures, we advise staying out of work or school for at least 1 to 2 weeks in order to recover from the stress of surgery and to allow time for healing.   If you need a work or school note this can be provided.   8. Smoking: If you are a smoker, you need to refrain from   smoking in the postoperative period. The nicotine in cigarettes will inhibit healing of your shoulder repair and decrease the chance of successful repair. Similarly, nicotine containing products (gum, patches) should be avoided.   Post-operative Brace: Apply and remove the brace you received as you were instructed to at the time of fitting and as described in detail as the brace's  instructions for use indicate.  Wear the brace for the period of time prescribed by your physician.  The brace can be cleaned with soap and water and allowed to air dry only.  Should the brace result in increased pain, decreased feeling (numbness/tingling), increased swelling or an overall worsening of your medical condition, please contact your doctor immediately.  If an emergency situation occurs as a result of wearing the brace after normal business hours, please dial 911 and seek immediate medical attention.  Let your doctor know if you have any further questions about the brace issued to you. Refer to the shoulder sling instructions for use if you have any questions regarding the correct fit of your shoulder sling.  [facility] Customer Care for Troubleshooting: [phone]  Video that illustrates how to properly use a shoulder sling: "Instructions for Proper Use of an Orthopaedic Sling" https://www.youtube.com/watch?v=AHZpn_Xo45w    AMBULATORY SURGERY  DISCHARGE INSTRUCTIONS   The drugs that you were given will stay in your system until tomorrow so for the next 24 hours you should not:  Drive an automobile Make any legal decisions Drink any alcoholic beverage   You may resume regular meals tomorrow.  Today it is better to start with liquids and gradually work up to solid foods.  You may eat anything you prefer, but it is better to start with liquids, then soup and crackers, and gradually work up to solid foods.   Please notify your doctor immediately if you have any unusual bleeding, trouble breathing, redness and pain at the surgery site, drainage, fever, or pain not relieved by medication.     Your post-operative visit with Dr.                                       is: Date:                        Time:    Please call to schedule your post-operative visit.  Additional Instructions:        

## 2021-10-05 NOTE — Anesthesia Procedure Notes (Signed)
Procedure Name: Intubation Date/Time: 10/05/2021 12:06 PM Performed by: Jonna Clark, CRNA Pre-anesthesia Checklist: Patient identified, Patient being monitored, Timeout performed, Emergency Drugs available and Suction available Patient Re-evaluated:Patient Re-evaluated prior to induction Oxygen Delivery Method: Circle system utilized Preoxygenation: Pre-oxygenation with 100% oxygen Induction Type: IV induction Ventilation: Mask ventilation without difficulty Laryngoscope Size: Miller and 2 Grade View: Grade I Tube type: Oral Tube size: 7.0 mm Number of attempts: 1 Airway Equipment and Method: Stylet Placement Confirmation: ETT inserted through vocal cords under direct vision, positive ETCO2 and breath sounds checked- equal and bilateral Secured at: 21 cm Tube secured with: Tape Dental Injury: Teeth and Oropharynx as per pre-operative assessment

## 2021-10-05 NOTE — Anesthesia Procedure Notes (Signed)
Anesthesia Regional Block: Interscalene brachial plexus block   Pre-Anesthetic Checklist: , timeout performed,  Correct Patient, Correct Site, Correct Laterality,  Correct Procedure, Correct Position, site marked,  Risks and benefits discussed,  Surgical consent,  Pre-op evaluation,  At surgeon's request and post-op pain management  Laterality: Right  Prep: chloraprep       Needles:  Injection technique: Single-shot  Needle Type: Echogenic Needle     Needle Length: 4cm  Needle Gauge: 25     Additional Needles:   Narrative:  Start time: 10/05/2021 10:48 AM End time: 10/05/2021 10:50 AM Injection made incrementally with aspirations every 5 mL.  Performed by: Personally  Anesthesiologist: Arita Miss, MD  Additional Notes: Patient's chart reviewed and they were deemed appropriate candidate for procedure, at surgeon's request. Patient educated about risks, benefits, and alternatives of the block including but not limited to: temporary or permanent nerve damage, bleeding, infection, damage to surround tissues, pneumothorax, hemidiaphragmatic paralysis, unilateral Horner's syndrome, block failure, local anesthetic toxicity. Patient expressed understanding. A formal time-out was conducted consistent with institution rules.  Monitors were applied, and minimal sedation used (see nursing record). The site was prepped with skin prep and allowed to dry, and sterile gloves were used. A high frequency linear ultrasound probe with probe cover was utilized throughout. C5-7 nerve roots located and appeared anatomically normal, local anesthetic injected around them, and echogenic block needle trajectory was monitored throughout. Aspiration performed every 3ml. Lung and blood vessels were avoided. All injections were performed without resistance and free of blood and paresthesias. The patient tolerated the procedure well.  Injectate: 81ml exparel + 21ml 0.5% bupivacaine

## 2021-10-06 ENCOUNTER — Encounter: Payer: Self-pay | Admitting: Orthopedic Surgery

## 2021-10-06 NOTE — Transfer of Care (Signed)
Immediate Anesthesia Transfer of Care Note  Patient: Jasmine Franco  Procedure(s) Performed: Right shoulder arthroscopic  rotator cuff repair (subscapularis & supraspinatus) with subacromial decompression, and athroscopic biceps tenodesis (Right: Shoulder)  Patient Location: PACU  Anesthesia Type:General  Level of Consciousness: drowsy  Airway & Oxygen Therapy: Patient Spontanous Breathing and Patient connected to nasal cannula oxygen  Post-op Assessment: Report given to RN and Post -op Vital signs reviewed and stable  Post vital signs: Reviewed and stable  Last Vitals:  Vitals Value Taken Time  BP 165/89 10/05/21 1600  Temp 36 C 10/05/21 1445  Pulse 67 10/05/21 1600  Resp 18 10/05/21 1600  SpO2 100 % 10/05/21 1600    Last Pain:  Vitals:   10/05/21 1635  PainSc: 8          Complications: No notable events documented.

## 2021-10-06 NOTE — Anesthesia Postprocedure Evaluation (Signed)
Anesthesia Post Note  Patient: Jasmine Franco  Procedure(s) Performed: Right shoulder arthroscopic  rotator cuff repair (subscapularis & supraspinatus) with subacromial decompression, and athroscopic biceps tenodesis (Right: Shoulder)  Patient location during evaluation: PACU Anesthesia Type: General Level of consciousness: awake and alert Pain management: pain level controlled Vital Signs Assessment: post-procedure vital signs reviewed and stable Respiratory status: spontaneous breathing, nonlabored ventilation, respiratory function stable and patient connected to nasal cannula oxygen Cardiovascular status: blood pressure returned to baseline and stable Postop Assessment: no apparent nausea or vomiting Anesthetic complications: no   No notable events documented.   Last Vitals:  Vitals:   10/05/21 1545 10/05/21 1600  BP: (!) 177/88 (!) 165/89  Pulse: 60 67  Resp: 19 18  Temp:    SpO2: 100% 100%    Last Pain:  Vitals:   10/05/21 1635  PainSc: 8                  Arita Miss

## 2021-11-09 ENCOUNTER — Encounter: Payer: Self-pay | Admitting: *Deleted

## 2021-11-10 ENCOUNTER — Encounter: Payer: Self-pay | Admitting: *Deleted

## 2021-11-10 ENCOUNTER — Other Ambulatory Visit: Payer: Self-pay

## 2021-11-10 ENCOUNTER — Encounter
Admission: RE | Disposition: A | Discharge status: Home / Self Care | Payer: Self-pay | Source: Home / Self Care | Attending: Gastroenterology

## 2021-11-10 ENCOUNTER — Ambulatory Visit
Admission: RE | Admit: 2021-11-10 | Discharge: 2021-11-10 | Disposition: A | Discharge status: Home / Self Care | Payer: 59 | Attending: Gastroenterology | Admitting: Gastroenterology

## 2021-11-10 ENCOUNTER — Ambulatory Visit: Payer: 59 | Admitting: Registered Nurse

## 2021-11-10 DIAGNOSIS — K5730 Diverticulosis of large intestine without perforation or abscess without bleeding: Secondary | ICD-10-CM | POA: Diagnosis not present

## 2021-11-10 DIAGNOSIS — K2970 Gastritis, unspecified, without bleeding: Secondary | ICD-10-CM | POA: Insufficient documentation

## 2021-11-10 DIAGNOSIS — Z1211 Encounter for screening for malignant neoplasm of colon: Secondary | ICD-10-CM | POA: Diagnosis not present

## 2021-11-10 DIAGNOSIS — Z6841 Body Mass Index (BMI) 40.0 and over, adult: Secondary | ICD-10-CM | POA: Diagnosis not present

## 2021-11-10 DIAGNOSIS — K2100 Gastro-esophageal reflux disease with esophagitis, without bleeding: Secondary | ICD-10-CM | POA: Insufficient documentation

## 2021-11-10 DIAGNOSIS — K3189 Other diseases of stomach and duodenum: Secondary | ICD-10-CM | POA: Diagnosis not present

## 2021-11-10 DIAGNOSIS — K640 First degree hemorrhoids: Secondary | ICD-10-CM | POA: Diagnosis not present

## 2021-11-10 DIAGNOSIS — R1310 Dysphagia, unspecified: Secondary | ICD-10-CM | POA: Diagnosis not present

## 2021-11-10 DIAGNOSIS — K449 Diaphragmatic hernia without obstruction or gangrene: Secondary | ICD-10-CM | POA: Diagnosis not present

## 2021-11-10 DIAGNOSIS — K635 Polyp of colon: Secondary | ICD-10-CM | POA: Insufficient documentation

## 2021-11-10 HISTORY — DX: Plantar fascial fibromatosis: M72.2

## 2021-11-10 HISTORY — DX: Overactive bladder: N32.81

## 2021-11-10 HISTORY — PX: COLONOSCOPY WITH PROPOFOL: SHX5780

## 2021-11-10 HISTORY — PX: ESOPHAGOGASTRODUODENOSCOPY (EGD) WITH PROPOFOL: SHX5813

## 2021-11-10 SURGERY — ESOPHAGOGASTRODUODENOSCOPY (EGD) WITH PROPOFOL
Anesthesia: General

## 2021-11-10 MED ORDER — DEXMEDETOMIDINE HCL IN NACL 200 MCG/50ML IV SOLN
INTRAVENOUS | Status: AC
  Filled 2021-11-10: qty 50

## 2021-11-10 MED ORDER — SODIUM CHLORIDE 0.9 % IV SOLN: INTRAVENOUS | Status: DC

## 2021-11-10 MED ORDER — PROPOFOL 500 MG/50ML IV EMUL
INTRAVENOUS | Status: DC | PRN
  Administered 2021-11-10: 150 ug/kg/min via INTRAVENOUS

## 2021-11-10 MED ORDER — LIDOCAINE HCL (CARDIAC) PF 100 MG/5ML IV SOSY
PREFILLED_SYRINGE | INTRAVENOUS | Status: DC | PRN
  Administered 2021-11-10: 100 mg via INTRAVENOUS

## 2021-11-10 MED ORDER — PROPOFOL 10 MG/ML IV BOLUS
INTRAVENOUS | Status: DC | PRN
  Administered 2021-11-10: 100 mg via INTRAVENOUS

## 2021-11-10 MED ORDER — DEXMEDETOMIDINE HCL IN NACL 200 MCG/50ML IV SOLN
INTRAVENOUS | Status: DC | PRN
  Administered 2021-11-10: 20 ug via INTRAVENOUS

## 2021-11-10 NOTE — Transfer of Care (Signed)
Immediate Anesthesia Transfer of Care Note  Patient: Jasmine Franco  Procedure(s) Performed: ESOPHAGOGASTRODUODENOSCOPY (EGD) WITH PROPOFOL COLONOSCOPY WITH PROPOFOL  Patient Location: Endoscopy Unit  Anesthesia Type:General  Level of Consciousness: drowsy  Airway & Oxygen Therapy: Patient Spontanous Breathing  Post-op Assessment: Report given to RN and Post -op Vital signs reviewed and stable  Post vital signs: Reviewed and stable  Last Vitals:  Vitals Value Taken Time  BP 104/66 11/10/21 1006  Temp 36.1 C 11/10/21 1006  Pulse 72 11/10/21 1007  Resp 15 11/10/21 1007  SpO2 95 % 11/10/21 1007  Vitals shown include unvalidated device data.  Last Pain:  Vitals:   11/10/21 1006  TempSrc: Temporal  PainSc: Asleep         Complications: No notable events documented.

## 2021-11-10 NOTE — H&P (Signed)
Outpatient short stay form Pre-procedure 11/10/2021  Jasmine Rubenstein, MD  Primary Physician: Gladstone Lighter, MD  Reason for visit:  Dysphagia/Colon cancer screening  History of present illness:   55 y/o lady with history of morbid obesity here for EGD for solid food dysphagia that happens once or twice a week. Started after cholecystectomy which was in February of this year. No significant abdominal surgeries. No family history of GI malignancies. No blood thinners.    Current Facility-Administered Medications:    0.9 %  sodium chloride infusion, , Intravenous, Continuous, Kalman Nylen, Hilton Cork, MD, Last Rate: 20 mL/hr at 11/10/21 0854, New Bag at 11/10/21 0854  Medications Prior to Admission  Medication Sig Dispense Refill Last Dose   Biotin 2500 MCG CAPS Take 2,500 mcg by mouth in the morning and at bedtime.   11/09/2021   buPROPion (ZYBAN) 150 MG 12 hr tablet Take 150 mg by mouth 2 (two) times daily.   11/09/2021   desonide (DESOWEN) 0.05 % cream Apply topically 2 (two) times daily.      ondansetron (ZOFRAN ODT) 4 MG disintegrating tablet Take 1 tablet (4 mg total) by mouth every 8 (eight) hours as needed for nausea or vomiting. 20 tablet 0 Past Week   oxybutynin (DITROPAN-XL) 10 MG 24 hr tablet Take 10 mg by mouth daily.   11/09/2021   oxyCODONE (ROXICODONE) 5 MG immediate release tablet Take 1-2 tablets (5-10 mg total) by mouth every 4 (four) hours as needed (pain). 30 tablet 0 Past Week   solifenacin (VESICARE) 5 MG tablet Take 5 mg by mouth daily.   11/09/2021   acetaminophen (TYLENOL) 500 MG tablet Take 2 tablets (1,000 mg total) by mouth every 8 (eight) hours. 90 tablet 2    clobetasol ointment (TEMOVATE) 7.68 % Apply 1 application topically daily as needed (irritation).      omeprazole (PRILOSEC) 20 MG capsule Take 20 mg by mouth daily. (Patient not taking: Reported on 11/10/2021)   Not Taking     Allergies  Allergen Reactions   Cortisone Other (See Comments)     redness   Tape Rash    Steri-strips postoperatively led to raised, punctate reddish lesions with mild drainage.      Past Medical History:  Diagnosis Date   Cancer ([place]) 2022   skin cancer on forehead   Dyspnea    History of kidney stones    OAB (overactive bladder)    Plantar fasciitis, bilateral     Review of systems:  Otherwise negative.    Physical Exam  Gen: Alert, oriented. Appears stated age.  HEENT: PERRLA. Lungs: No respiratory distress CV: RRR Abd: soft, benign, no masses Ext: No edema    Planned procedures: Proceed with EGD/colonoscopy. The patient understands the nature of the planned procedure, indications, risks, alternatives and potential complications including but not limited to bleeding, infection, perforation, damage to internal organs and possible oversedation/side effects from anesthesia. The patient agrees and gives consent to proceed.  Please refer to procedure notes for findings, recommendations and patient disposition/instructions.     Jasmine Rubenstein, MD [facility]

## 2021-11-10 NOTE — Anesthesia Preprocedure Evaluation (Signed)
Anesthesia Evaluation  Patient identified by MRN, date of birth, ID band Patient awake    Reviewed: Allergy & Precautions, NPO status , Patient's Chart, lab work & pertinent test results  History of Anesthesia Complications Negative for: history of anesthetic complications  Airway Mallampati: III  TM Distance: <3 FB Neck ROM: full    Dental  (+) Chipped   Pulmonary neg pulmonary ROS, neg shortness of breath, neg COPD,    Pulmonary exam normal breath sounds clear to auscultation       Cardiovascular Exercise Tolerance: Good (-) angina(-) Past MI negative cardio ROS Normal cardiovascular exam Rhythm:Regular Rate:Normal     Neuro/Psych PSYCHIATRIC DISORDERS Depression negative neurological ROS     GI/Hepatic Neg liver ROS, Bowel prep,GERD  Medicated,  Endo/Other  Morbid obesity  Renal/GU Renal disease     Musculoskeletal   Abdominal   Peds  Hematology negative hematology ROS (+)   Anesthesia Other Findings Past Medical History: No date: Cancer ([place]) No date: Dyspnea  Past Surgical History: No date: CHOLECYSTECTOMY  BMI    Body Mass Index: 41.74 kg/m      Reproductive/Obstetrics negative OB ROS                             Anesthesia Physical  Anesthesia Plan  ASA: 3  Anesthesia Plan: General   Post-op Pain Management:    Induction: Intravenous  PONV Risk Score and Plan: 3 and Treatment may vary due to age or medical condition, TIVA and Propofol infusion  Airway Management Planned: Natural Airway and Nasal Cannula  Additional Equipment: None  Intra-op Plan:   Post-operative Plan:   Informed Consent: I have reviewed the patients History and Physical, chart, labs and discussed the procedure including the risks, benefits and alternatives for the proposed anesthesia with the patient or authorized representative who has indicated his/her understanding and acceptance.      Dental Advisory Given  Plan Discussed with: Anesthesiologist, CRNA and Surgeon  Anesthesia Plan Comments: (Discussed risks of anesthesia with patient, including PONV, sore throat, lip/dental damage. Rare risks discussed as well, such as cardiorespiratory and neurological sequelae, and allergic reactions. Patient understands. Discussed r/b/a of interscalene block, including elective nature. Patient did not have an issue last time with her block, does not remember how long it lasted. Risks discussed: - Rare: bleeding, infection, nerve damage - shortness of breath from hemidiaphragmatic paralysis - unilateral horner's syndrome - poor/non-working blocks Patient understands and agrees. )       Anesthesia Quick Evaluation

## 2021-11-10 NOTE — Interval H&P Note (Signed)
History and Physical Interval Note:  11/10/2021 9:34 AM  Jasmine Franco  has presented today for surgery, with the diagnosis of Dysphagia r13.10, Colon cancer screening z12.11.  The various methods of treatment have been discussed with the patient and family. After consideration of risks, benefits and other options for treatment, the patient has consented to  Procedure(s): ESOPHAGOGASTRODUODENOSCOPY (EGD) WITH PROPOFOL (N/A) COLONOSCOPY WITH PROPOFOL (N/A) as a surgical intervention.  The patient's history has been reviewed, patient examined, no change in status, stable for surgery.  I have reviewed the patient's chart and labs.  Questions were answered to the patient's satisfaction.     Lesly Rubenstein  Ok to proceed with EGD/Colonoscopy

## 2021-11-10 NOTE — Op Note (Signed)
[facility] Patient Name: Jasmine Franco Procedure Date: 11/10/2021 9:24 AM MRN: [id number] Account #: [id number] Date of Birth: 04/03/1966 Admit Type: Outpatient Age: 55 Room: [facility] ENDO ROOM 1 Gender: Female Note Status: Finalized Instrument Name: Upper Endoscope [id number] Procedure:             Upper GI endoscopy Indications:           Dysphagia Providers:             Andrey Farmer MD, MD Medicines:             Monitored Anesthesia Care Complications:         No immediate complications. Estimated blood loss:                         Minimal. Procedure:             Pre-Anesthesia Assessment:                        - Prior to the procedure, a History and Physical was                         performed, and patient medications and allergies were                         reviewed. The patient is competent. The risks and                         benefits of the procedure and the sedation options and                         risks were discussed with the patient. All questions                         were answered and informed consent was obtained.                         Patient identification and proposed procedure were                         verified by the physician, the nurse, the anesthetist                         and the technician in the endoscopy suite. Mental                         Status Examination: alert and oriented. Airway                         Examination: normal oropharyngeal airway and neck                         mobility. Respiratory Examination: clear to                         auscultation. CV Examination: normal. Prophylactic                         Antibiotics: The patient does not require prophylactic  antibiotics. Prior Anticoagulants: The patient has                         taken no previous anticoagulant or antiplatelet                         agents. ASA Grade Assessment: III - A patient with                          severe systemic disease. After reviewing the risks and                         benefits, the patient was deemed in satisfactory                         condition to undergo the procedure. The anesthesia                         plan was to use monitored anesthesia care (MAC).                         Immediately prior to administration of medications,                         the patient was re-assessed for adequacy to receive                         sedatives. The heart rate, respiratory rate, oxygen                         saturations, blood pressure, adequacy of pulmonary                         ventilation, and response to care were monitored                         throughout the procedure. The physical status of the                         patient was re-assessed after the procedure.                        After obtaining informed consent, the endoscope was                         passed under direct vision. Throughout the procedure,                         the patient's blood pressure, pulse, and oxygen                         saturations were monitored continuously. The Endoscope                         was introduced through the mouth, and advanced to the                         second part of duodenum. The upper GI endoscopy was  accomplished without difficulty. The patient tolerated                         the procedure well. Findings:      LA Grade A (one or more mucosal breaks less than 5 mm, not extending       between tops of 2 mucosal folds) esophagitis with no bleeding was found.      A small hiatal hernia was present.      No endoscopic abnormality was evident in the esophagus to explain the       patient's complaint of dysphagia. Biopsies were obtained from the       proximal and distal esophagus with cold forceps for histology of       suspected eosinophilic esophagitis. Estimated blood loss was minimal.      Patchy moderate  inflammation characterized by congestion (edema) and       linear erosions was found in the gastric antrum. Almost appeared as GAVE       but patient does not have a history of liver disease. Biopsies were       taken with a cold forceps for histology. Estimated blood loss was       minimal.      Patchy mildly erythematous mucosa without active bleeding and with no       stigmata of bleeding was found in the duodenal bulb. Impression:            - LA Grade A reflux esophagitis with no bleeding.                        - Small hiatal hernia.                        - No endoscopic esophageal abnormality to explain                         patient's dysphagia. Biopsied.                        - Gastritis. Biopsied.                        - Erythematous duodenopathy. Recommendation:        - Await pathology results.                        - Perform a colonoscopy today.                        - If biopsies unrevealing and dysphagia persists then                         would recommend barium swallow to evaluate for any                         subtle strictures.                        - Take prescribed proton pump inhibitor 30 - 60                         minutes before meals. Procedure Code(s):     --- Professional ---  48016, Esophagogastroduodenoscopy, flexible,                         transoral; with biopsy, single or multiple Diagnosis Code(s):     --- Professional ---                        K21.00, Gastro-esophageal reflux disease with                         esophagitis, without bleeding                        K44.9, Diaphragmatic hernia without obstruction or                         gangrene                        R13.10, Dysphagia, unspecified                        K29.70, Gastritis, unspecified, without bleeding                        K31.89, Other diseases of stomach and duodenum CPT copyright 2019 American Medical Association. All rights reserved. The codes  documented in this report are preliminary and upon coder review may  be revised to meet current compliance requirements. Andrey Farmer MD, MD 11/10/2021 10:09:55 AM Number of Addenda: 0 Note Initiated On: 11/10/2021 9:24 AM Estimated Blood Loss:  Estimated blood loss was minimal.      [facility]

## 2021-11-10 NOTE — Op Note (Signed)
[facility]enter[facility] Patient Name: Jasmine Franco Procedure Date: 11/10/2021 9:24 AM MRN: [id number] Account #: [id number] Date of Birth: 10-01-1966 Admit Type: Outpatient Age: 55 Room: [facility] ENDO ROOM 1 Gender: Female Note Status: Finalized Instrument Name: Jasper Riling [id number] Procedure:             Colonoscopy Indications:           Screening for colorectal malignant neoplasm Providers:             Andrey Farmer MD, MD Medicines:             Monitored Anesthesia Care Complications:         No immediate complications. Estimated blood loss:                         Minimal. Procedure:             Pre-Anesthesia Assessment:                        - Prior to the procedure, a History and Physical was                         performed, and patient medications and allergies were                         reviewed. The patient is competent. The risks and                         benefits of the procedure and the sedation options and                         risks were discussed with the patient. All questions                         were answered and informed consent was obtained.                         Patient identification and proposed procedure were                         verified by the physician, the nurse, the anesthetist                         and the technician in the endoscopy suite. Mental                         Status Examination: alert and oriented. Airway                         Examination: normal oropharyngeal airway and neck                         mobility. Respiratory Examination: clear to                         auscultation. CV Examination: normal. Prophylactic                         Antibiotics: The patient does not require prophylactic  antibiotics. Prior Anticoagulants: The patient has                         taken no previous anticoagulant or antiplatelet                         agents. ASA Grade Assessment: III  - A patient with                         severe systemic disease. After reviewing the risks and                         benefits, the patient was deemed in satisfactory                         condition to undergo the procedure. The anesthesia                         plan was to use monitored anesthesia care (MAC).                         Immediately prior to administration of medications,                         the patient was re-assessed for adequacy to receive                         sedatives. The heart rate, respiratory rate, oxygen                         saturations, blood pressure, adequacy of pulmonary                         ventilation, and response to care were monitored                         throughout the procedure. The physical status of the                         patient was re-assessed after the procedure.                        After obtaining informed consent, the colonoscope was                         passed under direct vision. Throughout the procedure,                         the patient's blood pressure, pulse, and oxygen                         saturations were monitored continuously. The                         Colonoscope was introduced through the anus and                         advanced to the the cecum, identified by appendiceal  orifice and ileocecal valve. The colonoscopy was                         performed without difficulty. The patient tolerated                         the procedure well. The quality of the bowel                         preparation was excellent. Findings:      The perianal and digital rectal examinations were normal.      A less than 1 mm polyp was found in the cecum. The polyp was sessile.       The polyp was removed with a jumbo cold forceps. Resection and retrieval       were complete. Estimated blood loss was minimal.      Many small and large-mouthed diverticula were found in the sigmoid colon.       Internal hemorrhoids were found during retroflexion. The hemorrhoids       were Grade I (internal hemorrhoids that do not prolapse).      The exam was otherwise without abnormality on direct and retroflexion       views. Impression:            - One less than 1 mm polyp in the cecum, removed with                         a jumbo cold forceps. Resected and retrieved.                        - Diverticulosis in the sigmoid colon.                        - Internal hemorrhoids.                        - The examination was otherwise normal on direct and                         retroflexion views. Recommendation:        - Discharge patient to home.                        - Resume previous diet.                        - Continue present medications.                        - Await pathology results.                        - Repeat colonoscopy for surveillance based on                         pathology results.                        - Return to referring physician as previously  scheduled. Procedure Code(s):     --- Professional ---                        [id number], Colonoscopy, flexible; with biopsy, single or                         multiple Diagnosis Code(s):     --- Professional ---                        Z12.11, Encounter for screening for malignant neoplasm                         of colon                        K63.5, Polyp of colon                        K64.0, First degree hemorrhoids                        K57.30, Diverticulosis of large intestine without                         perforation or abscess without bleeding CPT copyright 2019 American Medical Association. All rights reserved. The codes documented in this report are preliminary and upon coder review may  be revised to meet current compliance requirements. Andrey Farmer MD, MD 11/10/2021 10:12:30 AM Number of Addenda: 0 Note Initiated On: 11/10/2021 9:24 AM Scope Withdrawal Time: 0 hours 7 minutes 52  seconds  Total Procedure Duration: 0 hours 11 minutes 51 seconds  Estimated Blood Loss:  Estimated blood loss was minimal.      [facility]

## 2021-11-10 NOTE — Anesthesia Postprocedure Evaluation (Signed)
Anesthesia Post Note  Patient: RAYLEEN WYRICK  Procedure(s) Performed: ESOPHAGOGASTRODUODENOSCOPY (EGD) WITH PROPOFOL COLONOSCOPY WITH PROPOFOL  Patient location during evaluation: Phase II Anesthesia Type: General Level of consciousness: awake and alert, awake and oriented Pain management: pain level controlled Vital Signs Assessment: post-procedure vital signs reviewed and stable Respiratory status: spontaneous breathing, nonlabored ventilation and respiratory function stable Cardiovascular status: blood pressure returned to baseline and stable Postop Assessment: no apparent nausea or vomiting Anesthetic complications: no   No notable events documented.   Last Vitals:  Vitals:   11/10/21 0840 11/10/21 1006  BP: (!) 168/96 104/66  Pulse: 88   Resp: 16   Temp: (!) 36.2 C (!) 36.1 C  SpO2: 100%     Last Pain:  Vitals:   11/10/21 1026  TempSrc:   PainSc: 0-No pain                 Phill Mutter

## 2021-11-11 ENCOUNTER — Encounter: Payer: Self-pay | Admitting: Gastroenterology

## 2021-11-12 LAB — SURGICAL PATHOLOGY

## 2022-04-15 ENCOUNTER — Other Ambulatory Visit: Payer: Self-pay | Admitting: Internal Medicine

## 2022-04-15 DIAGNOSIS — Z1231 Encounter for screening mammogram for malignant neoplasm of breast: Secondary | ICD-10-CM

## 2022-05-06 ENCOUNTER — Ambulatory Visit
Admission: EM | Admit: 2022-05-06 | Discharge: 2022-05-06 | Disposition: A | Discharge status: Home / Self Care | Payer: BLUE CROSS/BLUE SHIELD

## 2022-05-06 DIAGNOSIS — L989 Disorder of the skin and subcutaneous tissue, unspecified: Secondary | ICD-10-CM | POA: Diagnosis not present

## 2022-05-06 DIAGNOSIS — R030 Elevated blood-pressure reading, without diagnosis of hypertension: Secondary | ICD-10-CM

## 2022-05-06 MED ORDER — MUPIROCIN 2 % EX OINT: 1.0000 "application " | TOPICAL_OINTMENT | Freq: Two times a day (BID) | CUTANEOUS | 0 refills | Status: DC

## 2022-05-06 NOTE — Discharge Instructions (Addendum)
Use the mupirocin cream as directed.  Schedule a follow-up appointment with a podiatrist or dermatologist or your primary care provider next week. ? ?Your blood pressure is elevated today at 150/97; repeat 161/89.  Please have this rechecked by your primary care provider in 2-4 weeks.     ? ? ? ?

## 2022-05-06 NOTE — ED Triage Notes (Signed)
Patient presents to Urgent Care with complaints of right foot problem. She states there is a spot on side of right foot noted a week ago. She states she is concerned with MRSA. Not taking any meds  ? ?Denies fever, pain or irritation.  ?

## 2022-05-06 NOTE — ED Provider Notes (Signed)
?[facility] ? ? ? ?CSN: [id number] ?Arrival date & time: 05/06/22  1622 ? ? ?  ? ?History   ?Chief Complaint ?Chief Complaint  ?Patient presents with  ? Foot Problem  ?  "A spot"  ? ? ?HPI ?Jasmine Franco is a 56 y.o. female.  Patient presents with a sore on her right foot x1 week.  The area started as a red area on her skin and now has progressed to an ulcerated lesion.  She denies pain, redness, fever, drainage from the area, or other symptoms.  No treatments at home.  Her medical history includes skin cancer, plantar fasciitis, cholecystitis, overactive bladder, pyelonephritis, sepsis, thrombocytopenia, acute kidney injury, morbid obesity. ? ?The history is provided by the patient and medical records.  ? ?Past Medical History:  ?Diagnosis Date  ? Cancer [facility]) 2022  ? skin cancer on forehead  ? Dyspnea   ? History of kidney stones   ? OAB (overactive bladder)   ? Plantar fasciitis, bilateral   ? ? ?Patient Active Problem List  ? Diagnosis Date Noted  ? Visual disturbance   ? Cough   ? AKI (acute kidney injury) ([place])   ? Klebsiella sepsis ([place])   ? Lactic acidosis   ? Thrombocytopenia ([place])   ? Depression   ? Pyelonephritis 05/22/2021  ? Severe sepsis ([place]) 05/22/2021  ? Obesity, Class III, BMI 40-49.9 (morbid obesity) ([place]) 05/22/2021  ? Acute cholecystitis 02/11/2021  ? Symptomatic cholelithiasis 02/10/2021  ? Cholecystitis   ? Bilateral low back pain without sciatica 12/08/2020  ? OAB (overactive bladder) 12/08/2020  ? ? ?Past Surgical History:  ?Procedure Laterality Date  ? CHOLECYSTECTOMY  01/2021  ? COLONOSCOPY WITH PROPOFOL N/A 11/10/2021  ? Procedure: COLONOSCOPY WITH PROPOFOL;  Surgeon: Lesly Rubenstein, MD;  Location: [facility] ENDOSCOPY;  Service: Endoscopy;  Laterality: N/A;  ? ESOPHAGOGASTRODUODENOSCOPY (EGD) WITH PROPOFOL N/A 11/10/2021  ? Procedure: ESOPHAGOGASTRODUODENOSCOPY (EGD) WITH PROPOFOL;  Surgeon: Lesly Rubenstein, MD;  Location: [facility] ENDOSCOPY;  Service: Endoscopy;  Laterality:  N/A;  ? PRE-MALIGNANT / BENIGN SKIN LESION EXCISION    ? REVERSE SHOULDER ARTHROPLASTY Left 06/22/2021  ? Procedure: REVERSE SHOULDER ARTHROPLASTY, biceps tenodesis - RNFA needed;  Surgeon: Leim Fabry, MD;  Location: [facility] ORS;  Service: Orthopedics;  Laterality: Left;  ? SHOULDER ARTHROSCOPY WITH SUBACROMIAL DECOMPRESSION AND OPEN ROTATOR C Right 10/05/2021  ? Procedure: Right shoulder arthroscopic  rotator cuff repair (subscapularis & supraspinatus) with subacromial decompression, and athroscopic biceps tenodesis;  Surgeon: Leim Fabry, MD;  Location: [facility] ORS;  Service: Orthopedics;  Laterality: Right;  ? ? ?OB History   ?No obstetric history on file. ?  ? ? ? ?Home Medications   ? ?Prior to Admission medications   ?Medication Sig Start Date End Date Taking? Authorizing Provider  ?ergocalciferol (VITAMIN D2) 1.25 MG (50000 UT) capsule Take by mouth. 01/07/22  Yes [provider]  ?mupirocin ointment (BACTROBAN) 2 % Apply 1 application. topically 2 (two) times daily. 05/06/22  Yes Sharion Balloon, NP  ?Semaglutide-Weight Management 1 MG/0.5ML SOAJ Inject into the skin. 04/16/22  Yes [provider]  ?acetaminophen (TYLENOL) 500 MG tablet Take 2 tablets (1,000 mg total) by mouth every 8 (eight) hours. 10/05/21 10/05/22  Leim Fabry, MD  ?Biotin 1000 MCG tablet Take by mouth.    [provider]  ?Biotin 2500 MCG CAPS Take 2,500 mcg by mouth in the morning and at bedtime.    [provider]  ?buPROPion (ZYBAN) 150 MG 12 hr tablet  Take 150 mg by mouth 2 (two) times daily.    [provider]  ?clobetasol ointment (TEMOVATE) 6.26 % Apply 1 application topically daily as needed (irritation). 08/19/21   [provider]  ?desonide (DESOWEN) 0.05 % cream Apply topically 2 (two) times daily.    [provider]  ?omeprazole (PRILOSEC) 20 MG capsule Take 20 mg by mouth daily. ?Patient not taking: Reported on 11/10/2021    [provider]  ?ondansetron (ZOFRAN  ODT) 4 MG disintegrating tablet Take 1 tablet (4 mg total) by mouth every 8 (eight) hours as needed for nausea or vomiting. 10/05/21   Leim Fabry, MD  ?oxybutynin (DITROPAN-XL) 10 MG 24 hr tablet Take 10 mg by mouth daily. 05/13/21 05/13/22  [provider]  ?oxyCODONE (ROXICODONE) 5 MG immediate release tablet Take 1-2 tablets (5-10 mg total) by mouth every 4 (four) hours as needed (pain). 10/05/21 10/05/22  Leim Fabry, MD  ?solifenacin (VESICARE) 5 MG tablet Take 5 mg by mouth daily.    [provider]  ? ? ?Family History ?Family History  ?Problem Relation Age of Onset  ? Diabetes Mother   ? ? ?Social History ?Social History  ? ?Tobacco Use  ? Smoking status: Never  ? Smokeless tobacco: Never  ?Vaping Use  ? Vaping Use: Never used  ?Substance Use Topics  ? Alcohol use: Yes  ?  Comment: Occasional  ? Drug use: No  ? ? ? ?Allergies   ?Cortisone, Silicone, and Tape ? ? ?Review of Systems ?Review of Systems  ?Constitutional:  Negative for chills and fever.  ?Musculoskeletal:  Negative for arthralgias, gait problem and joint swelling.  ?Skin:  Positive for wound. Negative for color change.  ?All other systems reviewed and are negative. ? ? ?Physical Exam ?Triage Vital Signs ?ED Triage Vitals  ?Enc Vitals Group  ?   BP   ?   Pulse   ?   Resp   ?   Temp   ?   Temp src   ?   SpO2   ?   Weight   ?   Height   ?   Head Circumference   ?   Peak Flow   ?   Pain Score   ?   Pain Loc   ?   Pain Edu?   ?   Excl. in [place]?   ? ?No data found. ? ?Updated Vital Signs ?BP (!) 161/89   Pulse 97   Temp 97.9 ?F (36.6 ?C)   Resp 18   SpO2 95%  ? ?Visual Acuity ?Right Eye Distance:   ?Left Eye Distance:   ?Bilateral Distance:   ? ?Right Eye Near:   ?Left Eye Near:    ?Bilateral Near:    ? ?Physical Exam ?Vitals and nursing note reviewed.  ?Constitutional:   ?   General: She is not in acute distress. ?   Appearance: She is well-developed. She is obese. She is not ill-appearing.  ?HENT:  ?   Mouth/Throat:  ?   Mouth:  Mucous membranes are moist.  ?Cardiovascular:  ?   Rate and Rhythm: Normal rate and regular rhythm.  ?Pulmonary:  ?   Effort: Pulmonary effort is normal. No respiratory distress.  ?Musculoskeletal:     ?   General: No swelling, tenderness, deformity or signs of injury. Normal range of motion.  ?   Cervical back: Neck supple.  ?Skin: ?   General: Skin is warm and dry.  ?   Capillary Refill:  Capillary refill takes less than 2 seconds.  ?   Findings: Lesion present. No bruising or erythema.  ?   Comments: Healing lesion on right foot.  No drainage or erythema.  See pictures for details.  ?Neurological:  ?   General: No focal deficit present.  ?   Mental Status: She is alert and oriented to person, place, and time.  ?   Sensory: No sensory deficit.  ?   Motor: No weakness.  ?   Gait: Gait normal.  ?Psychiatric:     ?   Mood and Affect: Mood normal.     ?   Behavior: Behavior normal.  ? ? ? ? ? ? ?UC Treatments / Results  ?Labs ?(all labs ordered are listed, but only abnormal results are displayed) ?Labs Reviewed - No data to display ? ?EKG ? ? ?Radiology ?No results found. ? ?Procedures ?Procedures (including critical care time) ? ?Medications Ordered in UC ?Medications - No data to display ? ?Initial Impression / Assessment and Plan / UC Course  ?I have reviewed the triage vital signs and the nursing notes. ? ?Pertinent labs & imaging results that were available during my care of the patient were reviewed by me and considered in my medical decision making (see chart for details). ? ?  ?Skin lesion of right foot.  Elevated blood pressure reading.  The lesion on her foot appears to be healing.  No signs of cellulitis.  Treating with mupirocin cream.  Instructed patient to follow-up with a podiatrist or dermatologist or her PCP next week.  Discussed with patient that her blood pressure is elevated today and needs to be rechecked by her PCP in 2 to 4 weeks.  She agrees to plan of care. ? ?Final Clinical Impressions(s) / UC  Diagnoses  ? ?Final diagnoses:  ?Skin lesion of foot  ?Elevated blood pressure reading  ? ? ? ?Discharge Instructions   ? ?  ?Use the mupirocin cream as directed.  Schedule a follow-up appointment with a

## 2022-06-06 IMAGING — MR MR SHOULDER*R* W/O CM
6 series · 40 of 40 positions shown · non-contrast
Comparison: None of the right shoulder. Left shoulder MRI
06/01/2021 and radiographs 06/22/2021.

CLINICAL DATA: Right shoulder and upper arm pain with trouble
sleeping. Previous left shoulder arthroplasty.

EXAM:
MRI OF THE RIGHT SHOULDER WITHOUT CONTRAST
TECHNIQUE: Multiplanar, multisequence MR imaging of the shoulder was performed.
No intravenous contrast was administered.

[Series 5: T2 fat-sat · axial · right · 4.0mm · 0.50mm/px · z∈[-55,+65]mm · 7 of 26 slices shown (1 of 3)]
[im 1/26]
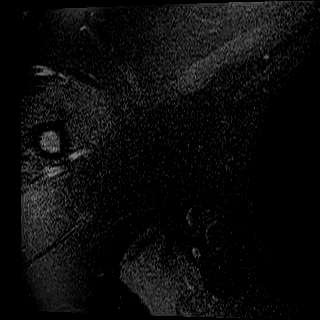
[im 5/26]
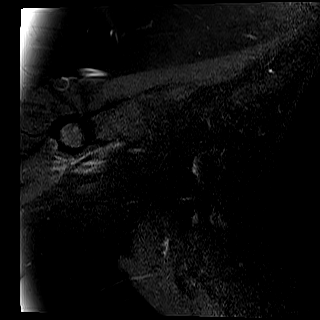
[im 9/26]
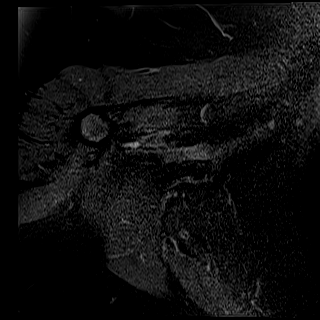
[im 13/26]
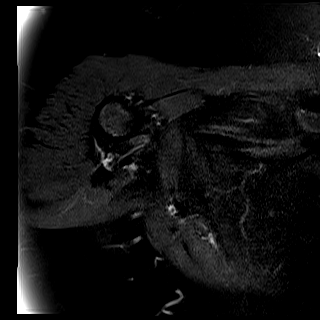
[im 17/26]
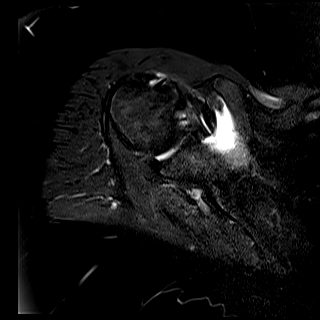
[im 21/26]
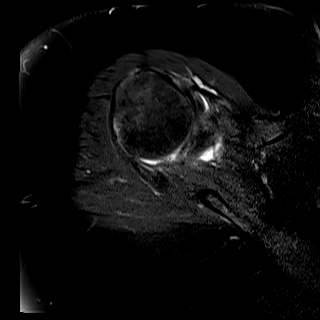
[im 26/26]
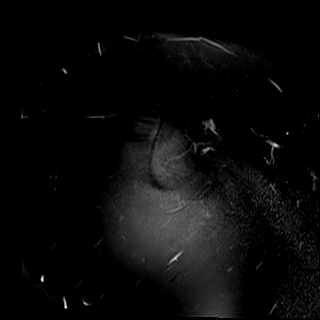

[Series 10: PD · oblique · right · 4.0mm · 0.44mm/px · 7 of 26 slices shown]
[im 1/26]
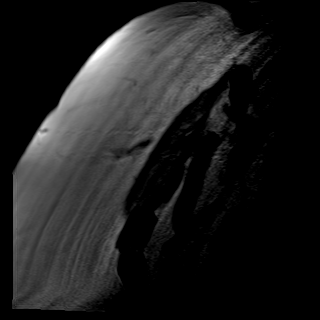
[im 5/26]
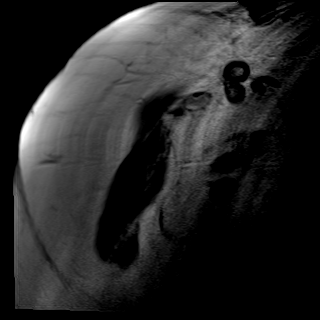
[im 9/26]
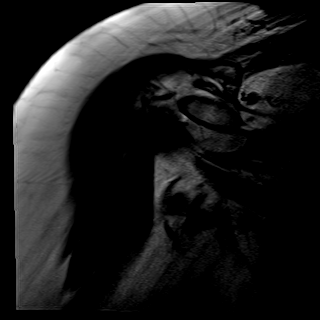
[im 13/26]
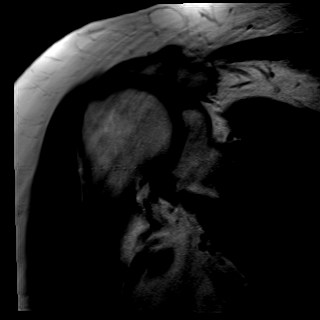
[im 17/26]
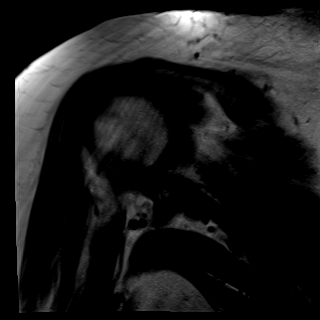
[im 21/26]
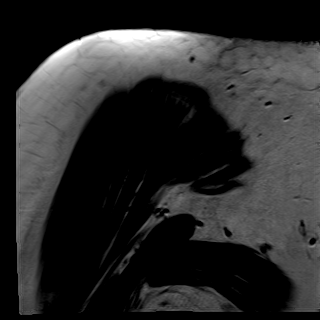
[im 26/26]
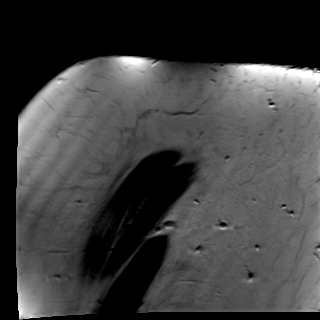

[Series 11: T2 fat-sat · oblique · right · 4.0mm · 0.44mm/px · 7 of 26 slices shown (2 of 3)]
[im 1/26]
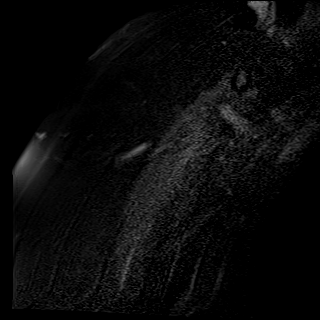
[im 5/26]
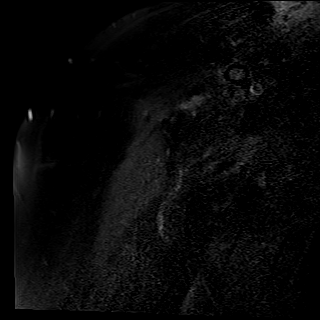
[im 9/26]
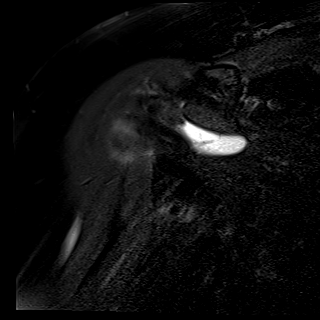
[im 13/26]
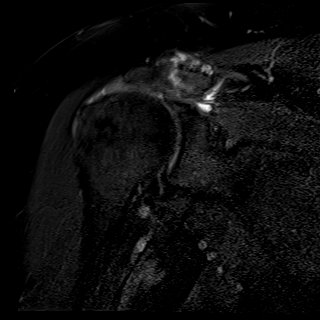
[im 17/26]
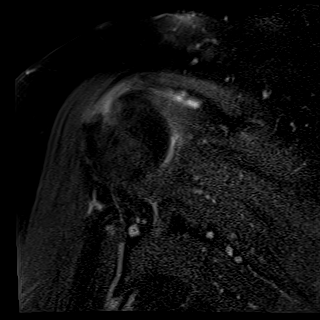
[im 21/26]
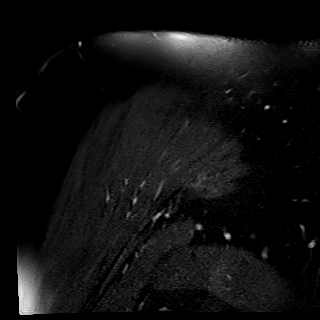
[im 26/26]
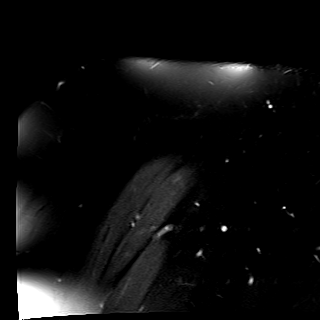

[Series 12: PD fat-sat · oblique · right · 4.0mm · 0.55mm/px · 7 of 25 slices shown]
[im 1/25]
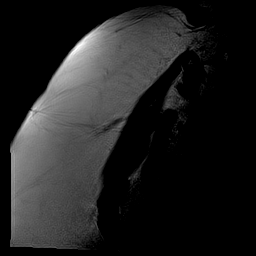
[im 5/25]
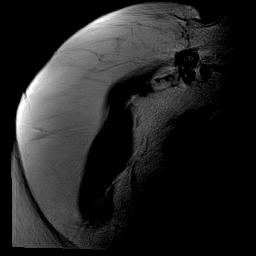
[im 9/25]
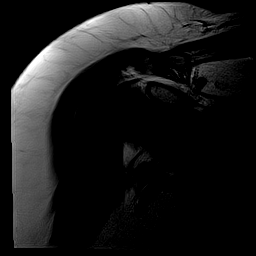
[im 13/25]
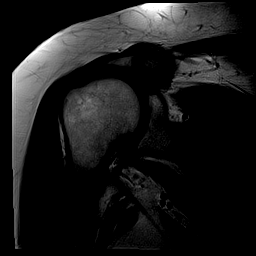
[im 17/25]
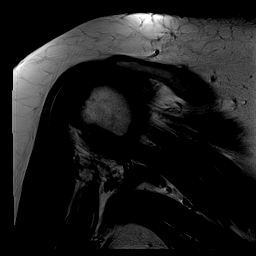
[im 21/25]
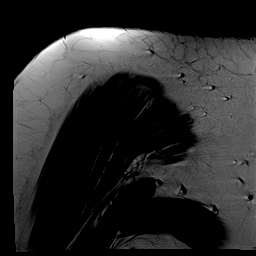
[im 25/25]
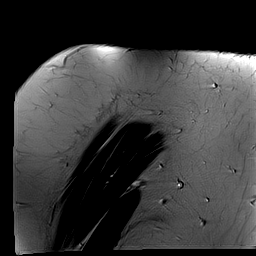

[Series 13: T2 fat-sat · coronal · right · 4.0mm · 0.55mm/px · 6 of 21 slices shown (3 of 3)]
[im 1/21]
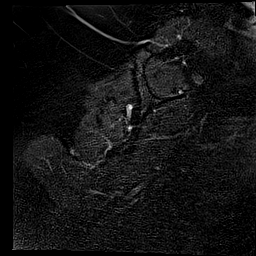
[im 5/21]
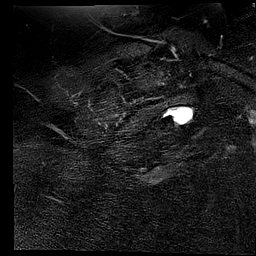
[im 9/21]
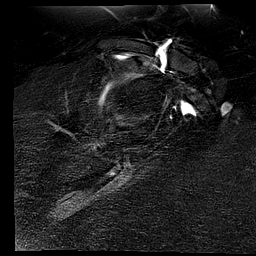
[im 13/21]
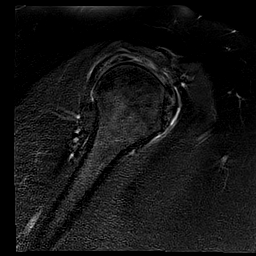
[im 17/21]
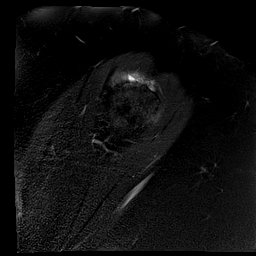
[im 21/21]
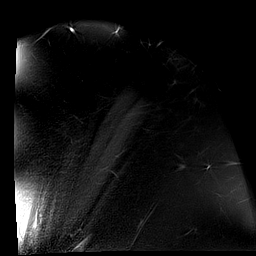

[Series 14: T1 · coronal · right · 4.0mm · 0.55mm/px · 6 of 21 slices shown]
[im 1/21]
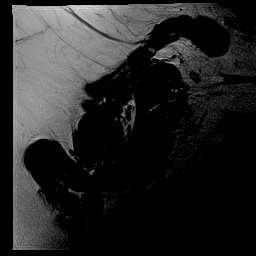
[im 5/21]
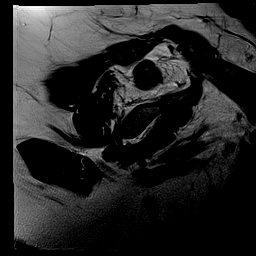
[im 9/21]
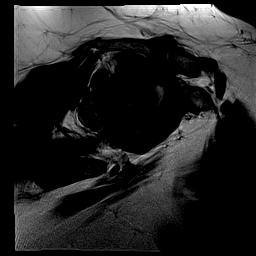
[im 13/21]
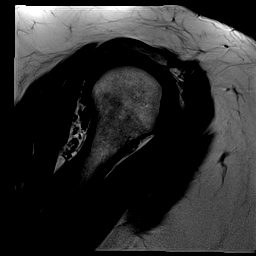
[im 17/21]
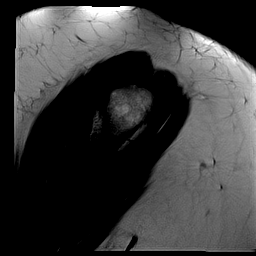
[im 21/21]
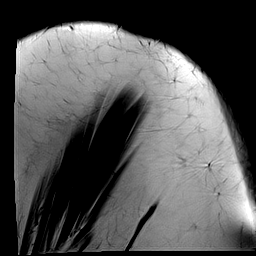

[40 of 40 positions shown; findings below may reference images not displayed]

FINDINGS: Despite efforts by the technologist and patient, moderate motion
artifact is present on today's exam and could not be eliminated.
This reduces exam sensitivity and specificity.

Rotator cuff: Rotator cuff evaluation is limited by the motion. The
distal supraspinatus tendon is attenuated and likely completely
torn. The superior fibers of the infraspinatus tendon are likely
involved. The subscapularis and teres minor tendons appear normal.

Muscles:  No focal muscular atrophy or edema identified.

Biceps long head:  Intact and normally positioned.

Acromioclavicular Joint: The acromion is type 2. There are moderate
acromioclavicular degenerative changes with fluid in the AC joint.
There is a small amount of fluid in the subacromial-subdeltoid
bursa.

Glenohumeral Joint: Mild glenohumeral degenerative changes. Small
joint effusion with most of the fluid in the superior subscapularis
recess.

Labrum: Labral assessment limited by the motion. No evidence of
labral tear or paralabral cyst. Mild joint capsular thickening in
the axillary recess.

Bones: No acute or significant extra-articular osseous findings.

Other: No significant soft tissue findings.
IMPRESSION: 1. The study is moderately motion degraded.
2. The motion limits assessment of the rotator cuff. The distal
supraspinatus tendon is attenuated with a probable full-thickness
tear which likely involves the superior fibers of the infraspinatus
tendon. No focal muscular atrophy.
3. The biceps tendon and labrum appear intact.
4. Mild joint capsular thickening as can be seen with adhesive
capsulitis.

## 2022-07-07 ENCOUNTER — Other Ambulatory Visit: Payer: Self-pay | Admitting: Podiatry

## 2022-07-12 ENCOUNTER — Ambulatory Visit: Payer: BLUE CROSS/BLUE SHIELD | Admitting: Podiatry

## 2022-07-12 ENCOUNTER — Encounter: Payer: Self-pay | Admitting: Podiatry

## 2022-07-12 DIAGNOSIS — M722 Plantar fascial fibromatosis: Secondary | ICD-10-CM

## 2022-07-12 MED ORDER — ETODOLAC ER 400 MG PO TB24: 400.0000 mg | ORAL_TABLET | Freq: Every day | ORAL | 3 refills | Status: DC

## 2022-07-12 NOTE — Progress Notes (Signed)
She presents today requesting a refill on her etodolac she says my feet are doing fine as long as I take the etodolac.  She states that my doctors know that I am not taking this medicine and that my blood work has been good.  Objective: I checked her blood work that was performed back in November and her BUN/creatinine and her AST and ALT were fine.  She has no reproducible pain on palpation of bilateral foot.  Assessment: Resolved Planter fasciitis bilateral.  Plan: Continue the use of etodolac as tolerated.

## 2022-07-14 ENCOUNTER — Ambulatory Visit: Payer: Self-pay | Admitting: Podiatry

## 2023-04-12 ENCOUNTER — Ambulatory Visit: Payer: BC Managed Care – PPO | Admitting: Podiatry

## 2023-04-13 ENCOUNTER — Encounter: Payer: Self-pay | Admitting: Podiatry

## 2023-04-13 ENCOUNTER — Ambulatory Visit (INDEPENDENT_AMBULATORY_CARE_PROVIDER_SITE_OTHER): Payer: BC Managed Care – PPO | Admitting: Podiatry

## 2023-04-13 DIAGNOSIS — D2371 Other benign neoplasm of skin of right lower limb, including hip: Secondary | ICD-10-CM | POA: Diagnosis not present

## 2023-04-13 DIAGNOSIS — M2042 Other hammer toe(s) (acquired), left foot: Secondary | ICD-10-CM

## 2023-04-13 DIAGNOSIS — M2041 Other hammer toe(s) (acquired), right foot: Secondary | ICD-10-CM | POA: Diagnosis not present

## 2023-04-13 DIAGNOSIS — D2372 Other benign neoplasm of skin of left lower limb, including hip: Secondary | ICD-10-CM | POA: Diagnosis not present

## 2023-04-13 MED ORDER — ETODOLAC ER 400 MG PO TB24: 400.0000 mg | ORAL_TABLET | Freq: Every day | ORAL | 3 refills | Status: DC

## 2023-04-13 NOTE — Progress Notes (Signed)
Jasmine Franco presents today for follow-up of her Planter fasciitis states that the tips of her toes are really becoming sore states that the plantar fasciitis is doing okay but she would like a refill on her etodolac.  Objective: Vital signs stable alert and oriented x 3.  Pulses are palpable.  She has hammertoe deformity with distal clavi.  No pain on palpation of the medial calcaneal tubercles bilateral.  The toes are flexible at the PIPJ they are contracted plantarly.  This is resulted in a distal clavus and the pain.  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  Resolving Planter fasciitis.  Plan: Discussed etiology pathology and surgical therapies.  We debrided the benign skin lesions.  Discussed bilateral flexor tenotomy's at the level of the PIPJ's second and third toes bilaterally we will go ahead and schedule her for that.  We placed sulcus pads.

## 2023-04-14 ENCOUNTER — Other Ambulatory Visit: Payer: Self-pay | Admitting: Internal Medicine

## 2023-04-14 DIAGNOSIS — Z1231 Encounter for screening mammogram for malignant neoplasm of breast: Secondary | ICD-10-CM

## 2023-05-12 ENCOUNTER — Ambulatory Visit
Admission: EM | Admit: 2023-05-12 | Discharge: 2023-05-12 | Disposition: A | Discharge status: Home / Self Care | Payer: BC Managed Care – PPO | Attending: Urgent Care | Admitting: Urgent Care

## 2023-05-12 DIAGNOSIS — R21 Rash and other nonspecific skin eruption: Secondary | ICD-10-CM

## 2023-05-12 MED ORDER — PREDNISONE 10 MG (21) PO TBPK
ORAL_TABLET | Freq: Every day | ORAL | 0 refills | Status: DC
Start: 2023-05-12 — End: 2023-05-25

## 2023-05-12 NOTE — ED Triage Notes (Signed)
Patient presents to [facility] for generalized body rash x 3 days. Taking benadryl. States she does not know how she got the rash.

## 2023-05-12 NOTE — ED Provider Notes (Signed)
[facility]    CSN: [id number] Arrival date & time: 05/12/23  0830      History   Chief Complaint Chief Complaint  Patient presents with   Rash    HPI Jasmine Franco is a 57 y.o. female.    Rash   Patient presents to urgent care with complaint of generalized body rash x 3 days.  Taking Benadryl to treat the rash.  She denies any knowledge of source of the rash.  Patient states she is being treated by dermatology with topical steroid clobetasol and she states it is not effective.  She has lesions on her legs which appear as papular/vesicular lesions on patches of erythema.  She has papular/vesicular lesions on her face.  Past Medical History:  Diagnosis Date   Cancer (HCC) 2022   skin cancer on forehead   Dyspnea    History of kidney stones    OAB (overactive bladder)    Plantar fasciitis, bilateral     Patient Active Problem List   Diagnosis Date Noted   Visual disturbance    Cough    AKI (acute kidney injury) (HCC)    Klebsiella sepsis (HCC)    Lactic acidosis    Thrombocytopenia (HCC)    Depression    Pyelonephritis 05/22/2021   Severe sepsis (HCC) 05/22/2021   Obesity, Class III, BMI 40-49.9 (morbid obesity) (HCC) 05/22/2021   Acute cholecystitis 02/11/2021   Symptomatic cholelithiasis 02/10/2021   Cholecystitis    Bilateral low back pain without sciatica 12/08/2020   OAB (overactive bladder) 12/08/2020    Past Surgical History:  Procedure Laterality Date   CHOLECYSTECTOMY  01/2021   COLONOSCOPY WITH PROPOFOL N/A 11/10/2021   Procedure: COLONOSCOPY WITH PROPOFOL;  Surgeon: Regis Bill, MD;  Location: [facility] ENDOSCOPY;  Service: Endoscopy;  Laterality: N/A;   ESOPHAGOGASTRODUODENOSCOPY (EGD) WITH PROPOFOL N/A 11/10/2021   Procedure: ESOPHAGOGASTRODUODENOSCOPY (EGD) WITH PROPOFOL;  Surgeon: Regis Bill, MD;  Location: [facility] ENDOSCOPY;  Service: Endoscopy;  Laterality: N/A;   PRE-MALIGNANT / BENIGN SKIN LESION EXCISION      REVERSE SHOULDER ARTHROPLASTY Left 06/22/2021   Procedure: REVERSE SHOULDER ARTHROPLASTY, biceps tenodesis - RNFA needed;  Surgeon: Signa Kell, MD;  Location: [facility] ORS;  Service: Orthopedics;  Laterality: Left;   SHOULDER ARTHROSCOPY WITH SUBACROMIAL DECOMPRESSION AND OPEN ROTATOR C Right 10/05/2021   Procedure: Right shoulder arthroscopic  rotator cuff repair (subscapularis & supraspinatus) with subacromial decompression, and athroscopic biceps tenodesis;  Surgeon: Signa Kell, MD;  Location: [facility] ORS;  Service: Orthopedics;  Laterality: Right;    OB History   No obstetric history on file.      Home Medications    Prior to Admission medications   Medication Sig Start Date End Date Taking? Authorizing Provider  Biotin 1000 MCG tablet Take by mouth.    [provider]  Biotin 2500 MCG CAPS Take 2,500 mcg by mouth in the morning and at bedtime.    [provider]  buPROPion (WELLBUTRIN XL) 300 MG 24 hr tablet Take 300 mg by mouth daily. 04/04/22   [provider]  clobetasol ointment (TEMOVATE) 0.05 % Apply 1 application topically daily as needed (irritation). 08/19/21   [provider]  desonide (DESOWEN) 0.05 % cream Apply topically 2 (two) times daily.    [provider]  ergocalciferol (VITAMIN D2) 1.25 MG (50000 UT) capsule Take by mouth. 01/07/22   [provider]  etodolac (LODINE XL) 400 MG 24 hr tablet Take 1 tablet (400 mg total) by  mouth daily. 07/12/22   Hyatt, Max T, DPM  etodolac (LODINE XL) 400 MG 24 hr tablet Take 1 tablet (400 mg total) by mouth daily. 04/13/23   Hyatt, Max T, DPM  hydrochlorothiazide (HYDRODIURIL) 25 MG tablet Take 1 tablet by mouth daily. 06/03/22 06/03/23  [provider]  mupirocin ointment (BACTROBAN) 2 % Apply 1 application. topically 2 (two) times daily. 05/06/22   Mickie Bail, NP  omeprazole (PRILOSEC) 20 MG capsule Take 20 mg by mouth daily. Patient not taking: Reported on 11/10/2021     [provider]  ondansetron (ZOFRAN ODT) 4 MG disintegrating tablet Take 1 tablet (4 mg total) by mouth every 8 (eight) hours as needed for nausea or vomiting. 10/05/21   Signa Kell, MD  Semaglutide-Weight Management 1 MG/0.5ML SOAJ Inject into the skin. 04/16/22   [provider]  solifenacin (VESICARE) 5 MG tablet Take 5 mg by mouth daily.    [provider]    Family History Family History  Problem Relation Age of Onset   Diabetes Mother     Social History Social History   Tobacco Use   Smoking status: Never   Smokeless tobacco: Never  Vaping Use   Vaping Use: Never used  Substance Use Topics   Alcohol use: Yes    Comment: Occasional   Drug use: No     Allergies   Cortisone, Silicone, and Tape   Review of Systems Review of Systems  Skin:  Positive for rash.     Physical Exam Triage Vital Signs ED Triage Vitals  Enc Vitals Group     BP 05/12/23 0842 125/84     Pulse Rate 05/12/23 0842 80     Resp 05/12/23 0842 18     Temp 05/12/23 0842 97.8 F (36.6 C)     Temp Source 05/12/23 0842 Temporal     SpO2 05/12/23 0842 98 %     Weight --      Height --      Head Circumference --      Peak Flow --      Pain Score 05/12/23 0843 0     Pain Loc --      Pain Edu? --      Excl. in [place]? --    No data found.  Updated Vital Signs BP 125/84 (BP Location: Left Arm)   Pulse 80   Temp 97.8 F (36.6 C) (Temporal)   Resp 18   SpO2 98%   Visual Acuity Right Eye Distance:   Left Eye Distance:   Bilateral Distance:    Right Eye Near:   Left Eye Near:    Bilateral Near:     Physical Exam Vitals reviewed.  Constitutional:      Appearance: Normal appearance.  Skin:    Findings: Erythema and rash present. Rash is papular and vesicular.       Neurological:     Mental Status: She is alert.      UC Treatments / Results  Labs (all labs ordered are listed, but only abnormal results are displayed) Labs Reviewed - No data to  display  EKG   Radiology No results found.  Procedures Procedures (including critical care time)  Medications Ordered in UC Medications - No data to display  Initial Impression / Assessment and Plan / UC Course  I have reviewed the triage vital signs and the nursing notes.  Pertinent labs & imaging results that were available during my care of the patient were  reviewed by me and considered in my medical decision making (see chart for details).  wide dispersion of papular/vesicular rash, allergic versus unknown etiology.  She is already being treated with topical steroid ointments which have not been effective.  Will prescribe a 6-day prednisone taper and encouraged the patient to return to dermatology or allergy for additional evaluation and treatment.  Reviewed chart history.   Counseled patient on potential for adverse effects with medications prescribed/recommended today, ER and return-to-clinic precautions discussed, patient verbalized understanding and agreement with care plan.    Final Clinical Impressions(s) / UC Diagnoses   Final diagnoses:  None   Discharge Instructions   None    ED Prescriptions   None    PDMP not reviewed this encounter.   Charma Igo, [place] 05/12/23 [phone]

## 2023-05-12 NOTE — Discharge Instructions (Signed)
I have prescribed a prednisone taper to treat your rash.  While this treatment may be temporarily effective, if it is caused by an allergic response to an allergen that you continue to be in contact with, it is likely to recur.  Please follow-up with a dermatologist or allergist.

## 2023-05-16 ENCOUNTER — Encounter: Payer: Self-pay | Admitting: Podiatry

## 2023-05-16 ENCOUNTER — Ambulatory Visit
Admission: RE | Admit: 2023-05-16 | Discharge: 2023-05-16 | Disposition: A | Discharge status: Home / Self Care | Payer: BC Managed Care – PPO | Source: Ambulatory Visit | Attending: Internal Medicine | Admitting: Internal Medicine

## 2023-05-16 ENCOUNTER — Ambulatory Visit (INDEPENDENT_AMBULATORY_CARE_PROVIDER_SITE_OTHER): Payer: BC Managed Care – PPO | Admitting: Podiatry

## 2023-05-16 DIAGNOSIS — M24576 Contracture, unspecified foot: Secondary | ICD-10-CM

## 2023-05-16 DIAGNOSIS — Z1231 Encounter for screening mammogram for malignant neoplasm of breast: Secondary | ICD-10-CM | POA: Diagnosis present

## 2023-05-16 DIAGNOSIS — M24574 Contracture, right foot: Secondary | ICD-10-CM | POA: Diagnosis not present

## 2023-05-16 DIAGNOSIS — M24575 Contracture, left foot: Secondary | ICD-10-CM

## 2023-05-16 NOTE — Patient Instructions (Signed)
Leave bandage in place and dry for 4 days, then remove. You may wash foot normally after removal of bandage. DO NOT SOAK FOOT! Dry completely afterwards and may use a bandaid over incision if needed. We will follow up with you in 1 weeks for recheck.  

## 2023-05-17 NOTE — Progress Notes (Signed)
She presents today for flexor tenotomy's to toes #2 and 3 bilaterally.  She states that she was still like to have this done help prevent from walking on the tips of her toes.  She states that is very painful and has continued to bother her since I last saw her.  States that the silicone sleeves do help.  Objective: Vital signs stable alert oriented x 3 she has rigid hammertoe deformity second left as opposed to the second right and third bilaterally.  Distal clavus is noted no ulcerative lesions no signs of infection.  Pulses remain strong and palpable.  Assessment: Pain in limb secondary to toe #2 and 3 bilateral contracted in nature.  Plan: Discussed etiology pathology conservative therapies at this point we went ahead and performed flexor tenotomy's after local anesthetic was administered then prepped and draped in normal sterile fashion utilizing 18-gauge at the level of the PIPJ plantarly the flexor tendons were transected and the toes were straightened with some contracture deformity remaining at the second left.  Dermabond was placed after the area was copiously lavaged and then they were dressed and placed in a [place]co shoe.  Follow-up with her in 1 to 2 weeks.

## 2023-05-20 ENCOUNTER — Other Ambulatory Visit: Payer: Self-pay | Admitting: Internal Medicine

## 2023-05-20 DIAGNOSIS — N63 Unspecified lump in unspecified breast: Secondary | ICD-10-CM

## 2023-05-20 DIAGNOSIS — R928 Other abnormal and inconclusive findings on diagnostic imaging of breast: Secondary | ICD-10-CM

## 2023-05-25 ENCOUNTER — Ambulatory Visit (INDEPENDENT_AMBULATORY_CARE_PROVIDER_SITE_OTHER): Payer: BC Managed Care – PPO | Admitting: Podiatry

## 2023-05-25 ENCOUNTER — Ambulatory Visit
Admission: RE | Admit: 2023-05-25 | Discharge: 2023-05-25 | Disposition: A | Discharge status: Home / Self Care | Payer: BC Managed Care – PPO | Source: Ambulatory Visit | Attending: Internal Medicine | Admitting: Internal Medicine

## 2023-05-25 ENCOUNTER — Encounter: Payer: Self-pay | Admitting: Podiatry

## 2023-05-25 DIAGNOSIS — R928 Other abnormal and inconclusive findings on diagnostic imaging of breast: Secondary | ICD-10-CM | POA: Insufficient documentation

## 2023-05-25 DIAGNOSIS — D2372 Other benign neoplasm of skin of left lower limb, including hip: Secondary | ICD-10-CM

## 2023-05-25 DIAGNOSIS — N63 Unspecified lump in unspecified breast: Secondary | ICD-10-CM | POA: Diagnosis present

## 2023-05-25 DIAGNOSIS — M24576 Contracture, unspecified foot: Secondary | ICD-10-CM

## 2023-05-25 DIAGNOSIS — D2371 Other benign neoplasm of skin of right lower limb, including hip: Secondary | ICD-10-CM | POA: Diagnosis not present

## 2023-05-25 DIAGNOSIS — Z9889 Other specified postprocedural states: Secondary | ICD-10-CM

## 2023-05-25 DIAGNOSIS — L02612 Cutaneous abscess of left foot: Secondary | ICD-10-CM

## 2023-05-25 MED ORDER — CEPHALEXIN 500 MG PO CAPS: 500.0000 mg | ORAL_CAPSULE | Freq: Three times a day (TID) | ORAL | 0 refills | Status: DC

## 2023-05-25 NOTE — Progress Notes (Signed)
She presents today for follow-up of her tenotomy second and third toes bilaterally.  She states that they are doing well though I think this 1 may have some infection in it.  She is pointing at the second toe lateral border left.  Denies fever chills nausea vomit muscle aches pains calf pain back pain chest pain shortness of breath.  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  Objective: Vital signs stable alert oriented x 3 pulses are palpable.  Surgical sites of gone on to heal uneventfully that she does have some mild erythema and edema along the lateral side of that second toe left foot.  This is the toe that remains rigid at the level of the PIPJ.  Assessment well-healing surgical foot mild cellulitic process second toe left foot.  Plan: Started her on Keflex and discussed possibly performing tenotomy's to the fourth toes bilaterally.  I would like to follow-up with her in 2 weeks for this cellulitic process should it continue.

## 2023-06-02 ENCOUNTER — Ambulatory Visit: Payer: BC Managed Care – PPO | Admitting: Plastic Surgery

## 2023-06-02 ENCOUNTER — Encounter: Payer: Self-pay | Admitting: Plastic Surgery

## 2023-06-02 VITALS — BP 118/87 | HR 72 | Ht 64.0 in | Wt 165.6 lb

## 2023-06-02 DIAGNOSIS — L987 Excessive and redundant skin and subcutaneous tissue: Secondary | ICD-10-CM

## 2023-06-02 NOTE — Progress Notes (Signed)
Referring Provider Enid Baas, MD [address] [place],  [place] [address]   CC:  Chief Complaint  Patient presents with   Advice Only      Jasmine Franco is an 57 y.o. female.  HPI: Ms. Urbaniak is a 57 year old female who is lost approximately 72 pounds over the past 12 to 18 months of medical weight loss therapy.  She presents today with complaints of excess skin on her upper arms which both interferes with wearing of close and causes discomfort.  She is interested in having the skin resected.  Allergies  Allergen Reactions   Cortisone Other (See Comments)    redness   Silicone Rash    Steri-strips postoperatively led to raised, punctate reddish lesions with mild drainage.    Tape Rash    Steri-strips postoperatively led to raised, punctate reddish lesions with mild drainage.     Outpatient Encounter Medications as of 06/02/2023  Medication Sig   buPROPion (WELLBUTRIN XL) 300 MG 24 hr tablet Take 300 mg by mouth daily.   ergocalciferol (VITAMIN D2) 1.25 MG (50000 UT) capsule Take by mouth.   hydrochlorothiazide (HYDRODIURIL) 25 MG tablet Take 1 tablet by mouth daily.   hydrocortisone 2.5 % cream Apply topically 2 (two) times daily as needed.   mupirocin ointment (BACTROBAN) 2 % Apply 1 application. topically 2 (two) times daily.   omeprazole (PRILOSEC) 20 MG capsule Take 20 mg by mouth daily.   Semaglutide-Weight Management 1 MG/0.5ML SOAJ Inject into the skin.   solifenacin (VESICARE) 5 MG tablet Take 5 mg by mouth daily.   [DISCONTINUED] Biotin 1000 MCG tablet Take by mouth.   [DISCONTINUED] Biotin 2500 MCG CAPS Take 2,500 mcg by mouth in the morning and at bedtime.   [DISCONTINUED] cephALEXin (KEFLEX) 500 MG capsule Take 1 capsule (500 mg total) by mouth 3 (three) times daily.   [DISCONTINUED] clobetasol ointment (TEMOVATE) 0.05 % Apply 1 application topically daily as needed (irritation).   [DISCONTINUED] desonide (DESOWEN) 0.05 % cream Apply topically 2  (two) times daily.   [DISCONTINUED] etodolac (LODINE XL) 400 MG 24 hr tablet Take 1 tablet (400 mg total) by mouth daily.   [DISCONTINUED] etodolac (LODINE XL) 400 MG 24 hr tablet Take 1 tablet (400 mg total) by mouth daily.   [DISCONTINUED] ondansetron (ZOFRAN ODT) 4 MG disintegrating tablet Take 1 tablet (4 mg total) by mouth every 8 (eight) hours as needed for nausea or vomiting.   No facility-administered encounter medications on file as of 06/02/2023.     Past Medical History:  Diagnosis Date   Cancer (HCC) 2022   skin cancer on forehead   Dyspnea    History of kidney stones    OAB (overactive bladder)    Plantar fasciitis, bilateral     Past Surgical History:  Procedure Laterality Date   CHOLECYSTECTOMY  01/2021   COLONOSCOPY WITH PROPOFOL N/A 11/10/2021   Procedure: COLONOSCOPY WITH PROPOFOL;  Surgeon: Regis Bill, MD;  Location: [facility];  Service: Endoscopy;  Laterality: N/A;   ESOPHAGOGASTRODUODENOSCOPY (EGD) WITH PROPOFOL N/A 11/10/2021   Procedure: ESOPHAGOGASTRODUODENOSCOPY (EGD) WITH PROPOFOL;  Surgeon: Regis Bill, MD;  Location: [facility];  Service: Endoscopy;  Laterality: N/A;   PRE-MALIGNANT / BENIGN SKIN LESION EXCISION     REVERSE SHOULDER ARTHROPLASTY Left 06/22/2021   Procedure: REVERSE SHOULDER ARTHROPLASTY, biceps tenodesis - RNFA needed;  Surgeon: Signa Kell, MD;  Location: [facility];  Service: Orthopedics;  Laterality: Left;   SHOULDER ARTHROSCOPY WITH SUBACROMIAL DECOMPRESSION AND  OPEN ROTATOR C Right 10/05/2021   Procedure: Right shoulder arthroscopic  rotator cuff repair (subscapularis & supraspinatus) with subacromial decompression, and athroscopic biceps tenodesis;  Surgeon: Signa Kell, MD;  Location: [facility];  Service: Orthopedics;  Laterality: Right;    Family History  Problem Relation Age of Onset   Diabetes Mother     Social History   Social History Narrative   Not on file     Review of Systems General: Denies  fevers, chills, weight loss CV: Denies chest pain, shortness of breath, palpitations Skin: Excess skin on the bilateral upper arms causing discomfort  Physical Exam    06/02/2023    9:23 AM 05/12/2023    8:42 AM 05/06/2022    5:16 PM  Vitals with BMI  Height 5\' 4"     Weight 165 lbs 10 oz    BMI 28.41    Systolic 118 125 409  Diastolic 87 84 89  Pulse 72 80     General:  No acute distress,  Alert and oriented, Non-Toxic, Normal speech and affect Skin: Patient has significant amount of excess upper arm skin Mammogram: Mammogram May 2024 was BI-RADS 0 due to concern of a finding in the right breast.  Follow-up ultrasound revealed this to be a benign lymph node.  She has a BI-RADS 2 finding Assessment/Plan Excess upper arm skin and fat: Patient would be acceptable candidate for bilateral upper extremity brachioplasty.  We discussed the procedure at length including the location of the incisions which tend to be very noticeable postoperatively.  She understands that there often is a small amount of redundant skin and fat at the elbow and that she will have an a scar that extends into and down the axilla we discussed the fact that there will be significant numbness around the incision and that she may have patches of numbness on the forearm.  We discussed the risks of bleeding, infection, and seroma formation.  All questions were answered to her satisfaction.  Photographs were obtained today with her consent.  Will schedule surgery at her request.  Santiago Glad 06/02/2023, 12:04 PM

## 2023-06-08 ENCOUNTER — Ambulatory Visit (INDEPENDENT_AMBULATORY_CARE_PROVIDER_SITE_OTHER): Payer: BC Managed Care – PPO | Admitting: Podiatry

## 2023-06-08 ENCOUNTER — Ambulatory Visit: Payer: BC Managed Care – PPO | Admitting: Podiatry

## 2023-06-08 DIAGNOSIS — M24576 Contracture, unspecified foot: Secondary | ICD-10-CM

## 2023-06-08 NOTE — Progress Notes (Signed)
She presents today for follow-up of her tenotomy's and small amount of cellulitis that she had last time she has completed her antibiotics.  Objective: There is no erythematous mild edema around the PIPJ of the second digit left foot.  Otherwise everything looks very nice and so much better.  She does have some tendency for the fourth toe to flex as well as long as the second and third toes come down I think that there will be opposition of the fourth toe otherwise we may need to consider tenotomy's to the fourth toe on the right foot.  Assessment: Well-healing surgical toe cellulitis.  Plan: Follow-up with me on an as-needed basis.

## 2023-06-09 ENCOUNTER — Telehealth: Payer: Self-pay

## 2023-06-09 NOTE — Telephone Encounter (Signed)
Patient called wanting to know about being scheduled and advised patient it will be about 4 weeks before someone calls her.

## 2023-07-18 ENCOUNTER — Telehealth: Payer: Self-pay | Admitting: *Deleted

## 2023-07-18 NOTE — Telephone Encounter (Signed)
Mychart message to patient about missing clinical to support medical necessity review for prior authorization of brachioplasty.

## 2023-08-30 ENCOUNTER — Telehealth: Payer: Self-pay | Admitting: Plastic Surgery

## 2023-08-30 NOTE — Telephone Encounter (Signed)
Quote has been sent via MyChart

## 2023-08-30 NOTE — Telephone Encounter (Signed)
Pt called and asked if she can get a quote bc her insurance denied and wants to know what it will cost, thank you Pt saw Dr Ladona Ridgel

## 2023-09-07 ENCOUNTER — Ambulatory Visit (INDEPENDENT_AMBULATORY_CARE_PROVIDER_SITE_OTHER): Payer: BC Managed Care – PPO | Admitting: Podiatry

## 2023-09-07 ENCOUNTER — Encounter: Payer: Self-pay | Admitting: Podiatry

## 2023-09-07 ENCOUNTER — Ambulatory Visit (INDEPENDENT_AMBULATORY_CARE_PROVIDER_SITE_OTHER): Payer: BC Managed Care – PPO

## 2023-09-07 DIAGNOSIS — M2011 Hallux valgus (acquired), right foot: Secondary | ICD-10-CM

## 2023-09-07 DIAGNOSIS — M7751 Other enthesopathy of right foot: Secondary | ICD-10-CM | POA: Diagnosis not present

## 2023-09-07 MED ORDER — DEXAMETHASONE SODIUM PHOSPHATE 120 MG/30ML IJ SOLN
2.0000 mg | Freq: Once | INTRAMUSCULAR | Status: AC
Start: 2023-09-07 — End: 2023-09-07
  Administered 2023-09-07: 2 mg via INTRA_ARTICULAR

## 2023-09-07 NOTE — Progress Notes (Signed)
She presents today for a chief complaint of a painful first metatarsophalangeal joint of the right foot stating that is starting to get painful with pressure and shoes I have a rash on the top of my foot right side is worse it was biopsied yesterday by Dr. Adolphus Birchwood.  Objective: Vital signs are stable she is alert and oriented x 3 pulses are palpable.  Hallux abductovalgus deformity bilateral right worse than left she has a rash to the dorsal aspect of the bilateral foot appears to be a contact dermatitis possibly associated with her new shoes.  She has limited range of motion of the first metatarsal phalangeal joint but fluctuance and tenderness on palpation of the medial aspect of the first metatarsal.  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  Early osteoarthritic changes.  Assessment: Rash dorsal aspect of the bilateral foot right greater than left.  Hallux valgus right with metatarsus adductus.  Bursitis and capsulitis first metatarsophalangeal joint right foot.  Plan: I injected dexamethasone and local anesthetic total of 2 mg was injected tolerated procedure well without complications we did discuss the need for fusion of the midfoot she states that she is not worried about making her toes straight she did not want to get rid of the bump.  And she like to have the fourth and fifth toes corrected at the same time.  I did explain to her that it be necessary that we clear if this rash first she understands that and is amenable to it we will follow-up with me in the future for surgical consult.

## 2023-09-19 ENCOUNTER — Ambulatory Visit (INDEPENDENT_AMBULATORY_CARE_PROVIDER_SITE_OTHER): Payer: BC Managed Care – PPO | Admitting: Podiatry

## 2023-09-19 ENCOUNTER — Encounter: Payer: Self-pay | Admitting: Podiatry

## 2023-09-19 DIAGNOSIS — M2011 Hallux valgus (acquired), right foot: Secondary | ICD-10-CM

## 2023-09-19 NOTE — Progress Notes (Signed)
Jasmine Franco presents today chief concern of painful first metatarsophalangeal joint of the right foot.  She states that she has had to start wearing bedroom shoes to work and that she cannot wear anything that rubs the bump on the side of her foot.  She states that the toes are starting to hurt as well as she refers to the fourth and fifth digits of the right foot.  She has tried steroid was nonsteroidals injections shoe gear changes physical therapy.  Objective: Vital signs are stable alert and oriented x 3.  Pulses are palpable.  Neurologic sensorium is intact Deetjen reflexes are intact.  Orthopedic evaluation demonstrates an increase in the first intermetatarsal angle that she does have a significant osseous medial deviation or adductus of the metatarsals.  This is resulted in the lateral deviation of her lesser digits as well as the hallux.  This is limiting the ability to correct the capital osteotomy to his normal anatomical position.  I explained this to the patient she understands it.  Radiographs were reviewed from previous visit.  This confirmed an increase in her first intermetatarsal angle with correction of the abductus.  Assessment hallux abductovalgus deformity with hammertoe deformities 4 5 all on the right foot.  Pes planovalgus right foot.  Plan: Discussed etiology pathology conservative versus surgical therapies.  At this point we consented her for an [facility] bunion repair with screw fixation as well as a derotational arthroplasty to the fourth and fifth digits which will prevent her from walking the edges of these toes.  We did discuss the possible postop complications which may include but are not limited to postop pain bleeding swell infection recurrence need for further surgery overcorrection under correction loss of digit loss limb loss of life.  Provided her with information regarding the surgery center anesthesia group.  Dispensed a cam walker to be worn after surgery but may be worn  prior to surgery with her bunion deformity becoming more painful.

## 2023-09-23 ENCOUNTER — Telehealth (INDEPENDENT_AMBULATORY_CARE_PROVIDER_SITE_OTHER): Payer: Self-pay | Admitting: Plastic Surgery

## 2023-09-23 NOTE — Telephone Encounter (Signed)
Patient called to get an update on her insurance authorization.  Please call her at [phone].

## 2023-09-27 ENCOUNTER — Telehealth: Payer: Self-pay | Admitting: Podiatry

## 2023-09-27 DIAGNOSIS — T502X5A Adverse effect of carbonic-anhydrase inhibitors, benzothiadiazides and other diuretics, initial encounter: Secondary | ICD-10-CM

## 2023-09-27 DIAGNOSIS — E876 Hypokalemia: Secondary | ICD-10-CM

## 2023-09-27 HISTORY — DX: Hypokalemia: T50.2X5A

## 2023-09-27 HISTORY — DX: Hypokalemia: E87.6

## 2023-09-27 NOTE — Telephone Encounter (Signed)
DOS- 10/07/2023  Serafina Royals [id number] HAMMERTOE 4TH AND 5TH [id number]  [employer] EFFECTIVE DATE- 12/27/2022  DEDUCTIBLE- $2000.00 WITH REMAINING $0.00 OOP- $6000.00 WITH REMAINING $917.16  COINSURANCE- 0%  PER [employer], PRIOR AUTHORIZATION HAS BEEN APPROVED FOR CPT CODES 41324 AND [phone]. GOOD FROM 10/07/2023 - 12/05/2023.  AUTH Order ID: [id number] ACMP #: [id number]

## 2023-09-29 ENCOUNTER — Encounter: Payer: Self-pay | Admitting: *Deleted

## 2023-09-29 ENCOUNTER — Telehealth: Payer: Self-pay | Admitting: Plastic Surgery

## 2023-09-29 NOTE — Telephone Encounter (Signed)
error 

## 2023-10-06 ENCOUNTER — Other Ambulatory Visit: Payer: Self-pay | Admitting: Podiatry

## 2023-10-06 MED ORDER — ONDANSETRON HCL 4 MG PO TABS: 4.0000 mg | ORAL_TABLET | Freq: Three times a day (TID) | ORAL | 0 refills | Status: AC | PRN

## 2023-10-06 MED ORDER — OXYCODONE-ACETAMINOPHEN 10-325 MG PO TABS: 1.0000 | ORAL_TABLET | Freq: Three times a day (TID) | ORAL | 0 refills | Status: AC | PRN

## 2023-10-06 MED ORDER — CEPHALEXIN 500 MG PO CAPS: 500.0000 mg | ORAL_CAPSULE | Freq: Three times a day (TID) | ORAL | 0 refills | Status: AC

## 2023-10-13 ENCOUNTER — Encounter: Payer: BC Managed Care – PPO | Admitting: Podiatry

## 2023-10-18 ENCOUNTER — Ambulatory Visit (INDEPENDENT_AMBULATORY_CARE_PROVIDER_SITE_OTHER): Payer: BC Managed Care – PPO | Admitting: Podiatry

## 2023-10-18 ENCOUNTER — Encounter: Payer: Self-pay | Admitting: Podiatry

## 2023-10-18 VITALS — BP 129/84 | HR 66

## 2023-10-18 DIAGNOSIS — Z01818 Encounter for other preprocedural examination: Secondary | ICD-10-CM

## 2023-10-18 DIAGNOSIS — M2041 Other hammer toe(s) (acquired), right foot: Secondary | ICD-10-CM

## 2023-10-18 DIAGNOSIS — M2011 Hallux valgus (acquired), right foot: Secondary | ICD-10-CM | POA: Diagnosis not present

## 2023-10-18 NOTE — Progress Notes (Signed)
Subjective:  Patient ID: Jasmine Franco, female    DOB: [birth year],  MRN: [id number]  Chief Complaint  Patient presents with   Bunions    "I am here for a consultation for surgery."    57 y.o. female presents with the above complaint.  Patient presents with right severe bunion deformity painful to touch is progressive gotten worse worse with ambulation worse with pressure she also has pain on palpation right fourth and fifth digit hammertoe contracture.  She wanted to get it evaluated.  She denies any other acute complaint she would like to discuss treatment options for she was scheduled for surgery by Dr. Al Corpus.  However she was unable to undergo surgery at the surgery center therefore she would like to undergo at the hospital with me.  Review of Systems: Negative except as noted in the HPI. Denies N/V/F/Ch.  Past Medical History:  Diagnosis Date   Cancer (HCC) 2022   skin cancer on forehead   Dyspnea    History of kidney stones    OAB (overactive bladder)    Plantar fasciitis, bilateral     Current Outpatient Medications:    buPROPion (WELLBUTRIN XL) 300 MG 24 hr tablet, Take 300 mg by mouth daily., Disp: , Rfl:    clobetasol ointment (TEMOVATE) 0.05 %, Apply 1 Application topically., Disp: , Rfl:    ergocalciferol (VITAMIN D2) 1.25 MG (50000 UT) capsule, Take by mouth., Disp: , Rfl:    omeprazole (PRILOSEC) 20 MG capsule, Take 20 mg by mouth daily., Disp: , Rfl:    ondansetron (ZOFRAN) 4 MG tablet, Take 1 tablet (4 mg total) by mouth every 8 (eight) hours as needed., Disp: 20 tablet, Rfl: 0   solifenacin (VESICARE) 5 MG tablet, Take 5 mg by mouth daily., Disp: , Rfl:    augmented betamethasone dipropionate (DIPROLENE-AF) 0.05 % cream, Apply topically 2 (two) times daily. (Patient not taking: Reported on 10/18/2023), Disp: , Rfl:    cephALEXin (KEFLEX) 500 MG capsule, Take 1 capsule (500 mg total) by mouth 3 (three) times daily. (Patient not taking: Reported on 10/18/2023), Disp:  30 capsule, Rfl: 0   hydrochlorothiazide (HYDRODIURIL) 25 MG tablet, Take 1 tablet by mouth daily., Disp: , Rfl:    hydrocortisone 2.5 % cream, Apply topically 2 (two) times daily as needed. (Patient not taking: Reported on 10/18/2023), Disp: , Rfl:    mupirocin ointment (BACTROBAN) 2 %, Apply 1 application. topically 2 (two) times daily. (Patient not taking: Reported on 10/18/2023), Disp: 22 g, Rfl: 0   Semaglutide-Weight Management 1 MG/0.5ML SOAJ, Inject into the skin. (Patient not taking: Reported on 10/18/2023), Disp: , Rfl:   Social History   Tobacco Use  Smoking Status Never  Smokeless Tobacco Never    Allergies  Allergen Reactions   Cortisone Other (See Comments)    redness   Silicone Rash    Steri-strips postoperatively led to raised, punctate reddish lesions with mild drainage.    Tape Rash    Steri-strips postoperatively led to raised, punctate reddish lesions with mild drainage.    Objective:   Vitals:   10/18/23 1002  BP: 129/84  Pulse: 66   There is no height or weight on file to calculate BMI. Constitutional Well developed. Well nourished.  Vascular Dorsalis pedis pulses palpable bilaterally. Posterior tibial pulses palpable bilaterally. Capillary refill normal to all digits.  No cyanosis or clubbing noted. Pedal hair growth normal.  Neurologic Normal speech. Oriented to person, place, and time. Epicritic sensation to light touch grossly  present bilaterally.  Dermatologic Nails well groomed and normal in appearance. No open wounds. No skin lesions.  Orthopedic: Normal joint ROM without pain or crepitus bilaterally. Hallux abductovalgus deformity present Left 1st MPJ diminished range of motion. Left 1st TMT with gross hypermobility. Right 1st MPJ diminished range of motion  Right 1st TMT with gross hypermobility. Lesser digital contractures present right fourth and fifth digit hammertoe contracture semiflexible pain on palpation   Radiographs: Taken  and reviewed. Hallux abductovalgus deformity present. Metatarsal parabola normal. 1st/2nd IMA: Severe; TSP: 6 out of 7.  Right fourth and fifth digit hammertoe contracture.  Assessment:   1. Acquired hallux valgus of right foot   2. Acquired hammertoe of right foot   3. Encounter for preoperative examination for general surgical procedure    Plan:  Patient was evaluated and treated and all questions answered.  Hallux abductovalgus deformity, right severe with hammertoe contracture right fourth and fifth digit -XR as above. -Patient has failed all conservative therapy and wishes to proceed with surgical intervention. All risks, benefits, and alternatives discussed with patient. No guarantees given. Consent reviewed and signed by patient. Post-op course explained at length. -Planned procedures: Right Lapidus bunionectomy with possible phalangeal osteotomy and right fourth and fifth digit hammertoe contracture  -Risk factors: None -I discussed my preoperative intra or postoperative plan with the patient in extensive detail given the amount of pain that she is having from the bunion deformity and hammertoe contracture in the setting of failed conservative care I believe patient will benefit from surgical intervention at the hospital setting.  I discussed with patient she states understanding.  Would like to proceed with surgery -Informed surgical risk consent was reviewed and read aloud to the patient.  I reviewed the films.  I have discussed my findings with the patient in great detail.  I have discussed all risks including but not limited to infection, stiffness, scarring, limp, disability, deformity, damage to blood vessels and nerves, numbness, poor healing, need for braces, arthritis, chronic pain, amputation, death.  All benefits and realistic expectations discussed in great detail.  I have made no promises as to the outcome.  I have provided realistic expectations.  I have offered the patient a  2nd opinion, which they have declined and assured me they preferred to proceed despite the risks   No follow-ups on file.

## 2023-10-20 ENCOUNTER — Encounter: Payer: BC Managed Care – PPO | Admitting: Podiatry

## 2023-10-27 ENCOUNTER — Encounter: Payer: BC Managed Care – PPO | Admitting: Podiatry

## 2023-10-28 DIAGNOSIS — M2041 Other hammer toe(s) (acquired), right foot: Secondary | ICD-10-CM

## 2023-10-28 DIAGNOSIS — M21611 Bunion of right foot: Secondary | ICD-10-CM

## 2023-10-28 HISTORY — DX: Bunion of right foot: M21.611

## 2023-10-28 HISTORY — DX: Other hammer toe(s) (acquired), right foot: M20.41

## 2023-11-03 ENCOUNTER — Telehealth: Payer: Self-pay | Admitting: Podiatry

## 2023-11-03 ENCOUNTER — Encounter: Payer: BC Managed Care – PPO | Admitting: Podiatry

## 2023-11-03 NOTE — Telephone Encounter (Signed)
DOS-11/21/2023  [facility]KEN OSTEOTOMY [id number] LAPIDUS PROCEDURE INCLUDING BUNIONECTOMY RT- [id number] HAMMERTOE REPAIR [id number]  [employer] EFFECTIVE DATE-12/27/2022  DEDUCTIBLE- $2000.00 WITH REMAINING $0.00 OOP-$6000.00 WITH REMAINING $825.42 COINSURANCE- 0%  PER THE [employer], PRIOR AUTH HAS BEEN APPROVED FOR CPT CODES [id number] AND [id number]. GOOD FROM 11/21/2023 - 01/19/2024.   AUTH REF #: Order ID: [id number] ACMP #: [id number]

## 2023-11-10 ENCOUNTER — Telehealth: Payer: Self-pay | Admitting: Podiatry

## 2023-11-10 NOTE — Telephone Encounter (Signed)
Called Ms. Fuhrmann @ [phone] -- confirmed receipt of her STD paperwork from [employer] (employer).  Advised will be completed and faxed to Karolee Ohs, her Case Manager @ [phone].       ***  STD paperwork has been completed and faxed accordingly ....       J. Abbott -- 11/10/2023

## 2023-11-14 ENCOUNTER — Other Ambulatory Visit: Payer: Self-pay

## 2023-11-14 ENCOUNTER — Encounter
Admission: RE | Admit: 2023-11-14 | Discharge: 2023-11-14 | Disposition: A | Discharge status: Home / Self Care | Payer: BC Managed Care – PPO | Source: Ambulatory Visit | Attending: Podiatry | Admitting: Podiatry

## 2023-11-14 VITALS — BP 124/80 | HR 63 | Temp 97.7°F | Resp 16 | Ht 64.0 in | Wt 166.0 lb

## 2023-11-14 DIAGNOSIS — Z01812 Encounter for preprocedural laboratory examination: Secondary | ICD-10-CM

## 2023-11-14 DIAGNOSIS — Z01818 Encounter for other preprocedural examination: Secondary | ICD-10-CM | POA: Diagnosis present

## 2023-11-14 DIAGNOSIS — I10 Essential (primary) hypertension: Secondary | ICD-10-CM | POA: Diagnosis not present

## 2023-11-14 DIAGNOSIS — Z01810 Encounter for preprocedural cardiovascular examination: Secondary | ICD-10-CM | POA: Diagnosis not present

## 2023-11-14 HISTORY — DX: Excessive and redundant skin and subcutaneous tissue: L98.7

## 2023-11-14 HISTORY — DX: Obesity, unspecified: E66.9

## 2023-11-14 HISTORY — DX: Hyperlipidemia, unspecified: E78.5

## 2023-11-14 HISTORY — DX: Thrombocytopenia, unspecified: D69.6

## 2023-11-14 HISTORY — DX: Gastro-esophageal reflux disease without esophagitis: K21.9

## 2023-11-14 HISTORY — DX: Vitamin D deficiency, unspecified: E55.9

## 2023-11-14 HISTORY — DX: Prediabetes: R73.03

## 2023-11-14 HISTORY — DX: Essential (primary) hypertension: I10

## 2023-11-14 HISTORY — DX: Calculus of gallbladder without cholecystitis without obstruction: K80.20

## 2023-11-14 LAB — BASIC METABOLIC PANEL
Anion gap: 7 (ref 5–15)
BUN: 20 mg/dL (ref 6–20)
CO2: 29 mmol/L (ref 22–32)
Calcium: 8.9 mg/dL (ref 8.9–10.3)
Chloride: 102 mmol/L (ref 98–111)
Creatinine, Ser: 0.75 mg/dL (ref 0.44–1.00)
GFR, Estimated: >60 mL/min (ref >60)
Glucose, Bld: 94 mg/dL (ref 70–99)
Potassium: 3.7 mmol/L (ref 3.5–5.1)
Sodium: 138 mmol/L (ref 135–145)

## 2023-11-14 NOTE — Patient Instructions (Signed)
Your procedure is scheduled on: Monday, November 25 Report to the Registration Desk on the 1st floor of the [facility] To find out your arrival time, please call [phone] between 1PM - 3PM on: Friday, November 22 If your arrival time is 6:00 am, do not arrive before that time as the [facility] entrance doors do not open until 6:00 am.  REMEMBER: Instructions that are not followed completely may result in serious medical risk, up to and including death; or upon the discretion of your surgeon and anesthesiologist your surgery may need to be rescheduled.  Do not eat food after midnight the night before surgery.  No gum chewing or hard candies.  You may however, drink CLEAR liquids up to 2 hours before you are scheduled to arrive for your surgery. Do not drink anything within 2 hours of your scheduled arrival time.  Clear liquids include: - water  - apple juice without pulp - gatorade (not RED colors) - black coffee or tea (Do NOT add milk or creamers to the coffee or tea) Do NOT drink anything that is not on this list.  One week prior to surgery: starting November 18 Stop Anti-inflammatories (NSAIDS) such as Advil, Aleve, Ibuprofen, Motrin, Naproxen, Naprosyn and Aspirin based products such as Excedrin, Goody's Powder, BC Powder. Stop ANY OVER THE COUNTER supplements until after surgery.  You may however, continue to take Tylenol if needed for pain up until the day of surgery.  Continue taking all of your other prescription medications up until the day of surgery.  DO NOT TAKE ANY MEDICATIONS ON THE DAY OF SURGERY.  No Alcohol for 24 hours before or after surgery.  No Smoking including e-cigarettes for 24 hours before surgery.  No chewable tobacco products for at least 6 hours before surgery.  No nicotine patches on the day of surgery.  Do not use any "recreational" drugs for at least a week (preferably 2 weeks) before your surgery.  Please be advised that the combination  of cocaine and anesthesia may have negative outcomes, up to and including death. If you test positive for cocaine, your surgery will be cancelled.  On the morning of surgery brush your teeth with toothpaste and water, you may rinse your mouth with mouthwash if you wish. Do not swallow any toothpaste or mouthwash.  Use CHG Soap as directed on instruction sheet.  Do not wear jewelry, make-up, hairpins, clips or nail polish.  For welded (permanent) jewelry: bracelets, anklets, waist bands, etc.  Please have this removed prior to surgery.  If it is not removed, there is a chance that hospital personnel will need to cut it off on the day of surgery.  Do not wear lotions, powders, or perfumes.   Do not shave body hair from the neck down 48 hours before surgery.  Contact lenses, hearing aids and dentures may not be worn into surgery.  Do not bring valuables to the hospital. [facility] is not responsible for any missing/lost belongings or valuables.   Notify your doctor if there is any change in your medical condition (cold, fever, infection).  Wear comfortable clothing (specific to your surgery type) to the hospital.  After surgery, you can help prevent lung complications by doing breathing exercises.  Take deep breaths and cough every 1-2 hours.   If you are being discharged the day of surgery, you will not be allowed to drive home. You will need a responsible individual to drive you home and stay with you for 24  hours after surgery.   If you are taking public transportation, you will need to have a responsible individual with you.  Please call the Pre-admissions Testing Dept. at [phone] if you have any questions about these instructions.  Surgery Visitation Policy:  Patients having surgery or a procedure may have two visitors.  Children under the age of 1 must have an adult with them who is not the patient.     Preparing for Surgery with CHLORHEXIDINE GLUCONATE (CHG)  Soap  Chlorhexidine Gluconate (CHG) Soap  o An antiseptic cleaner that kills germs and bonds with the skin to continue killing germs even after washing  o Used for showering the night before surgery and morning of surgery  Before surgery, you can play an important role by reducing the number of germs on your skin.  CHG (Chlorhexidine gluconate) soap is an antiseptic cleanser which kills germs and bonds with the skin to continue killing germs even after washing.  Please do not use if you have an allergy to CHG or antibacterial soaps. If your skin becomes reddened/irritated stop using the CHG.  1. Shower the NIGHT BEFORE SURGERY and the MORNING OF SURGERY with CHG soap.  2. If you choose to wash your hair, wash your hair first as usual with your normal shampoo.  3. After shampooing, rinse your hair and body thoroughly to remove the shampoo.  4. Use CHG as you would any other liquid soap. You can apply CHG directly to the skin and wash gently with a scrungie or a clean washcloth.  5. Apply the CHG soap to your body only from the neck down. Do not use on open wounds or open sores. Avoid contact with your eyes, ears, mouth, and genitals (private parts). Wash face and genitals (private parts) with your normal soap.  6. Wash thoroughly, paying special attention to the area where your surgery will be performed.  7. Thoroughly rinse your body with warm water.  8. Do not shower/wash with your normal soap after using and rinsing off the CHG soap.  9. Pat yourself dry with a clean towel.  10. Wear clean pajamas to bed the night before surgery.  12. Place clean sheets on your bed the night of your first shower and do not sleep with pets.  13. Shower again with the CHG soap on the day of surgery prior to arriving at the hospital.  14. Do not apply any deodorants/lotions/powders.  15. Please wear clean clothes to the hospital.

## 2023-11-17 ENCOUNTER — Encounter: Payer: BC Managed Care – PPO | Admitting: Podiatry

## 2023-11-20 MED ORDER — ORAL CARE MOUTH RINSE: 15.0000 mL | Freq: Once | OROMUCOSAL | Status: AC

## 2023-11-20 MED ORDER — CHLORHEXIDINE GLUCONATE 0.12 % MT SOLN
15.0000 mL | Freq: Once | OROMUCOSAL | Status: AC
  Administered 2023-11-21: 15 mL via OROMUCOSAL

## 2023-11-20 MED ORDER — CEFAZOLIN SODIUM-DEXTROSE 2-4 GM/100ML-% IV SOLN
2.0000 g | Freq: Once | INTRAVENOUS | Status: AC
  Administered 2023-11-21: 2 g via INTRAVENOUS

## 2023-11-20 MED ORDER — LACTATED RINGERS IV SOLN: INTRAVENOUS | Status: DC

## 2023-11-21 ENCOUNTER — Other Ambulatory Visit: Payer: Self-pay | Admitting: Podiatry

## 2023-11-21 ENCOUNTER — Other Ambulatory Visit: Payer: Self-pay

## 2023-11-21 ENCOUNTER — Ambulatory Visit: Payer: BC Managed Care – PPO

## 2023-11-21 ENCOUNTER — Encounter: Payer: Self-pay | Admitting: Podiatry

## 2023-11-21 ENCOUNTER — Ambulatory Visit
Admission: RE | Admit: 2023-11-21 | Discharge: 2023-11-21 | Disposition: A | Discharge status: Home / Self Care | Payer: BC Managed Care – PPO | Attending: Podiatry | Admitting: Podiatry

## 2023-11-21 ENCOUNTER — Ambulatory Visit: Payer: BC Managed Care – PPO | Admitting: Certified Registered Nurse Anesthetist

## 2023-11-21 ENCOUNTER — Encounter
Admission: RE | Disposition: A | Discharge status: Home / Self Care | Payer: Self-pay | Source: Home / Self Care | Attending: Podiatry

## 2023-11-21 DIAGNOSIS — I10 Essential (primary) hypertension: Secondary | ICD-10-CM | POA: Insufficient documentation

## 2023-11-21 DIAGNOSIS — M2011 Hallux valgus (acquired), right foot: Secondary | ICD-10-CM | POA: Diagnosis not present

## 2023-11-21 DIAGNOSIS — M21611 Bunion of right foot: Secondary | ICD-10-CM | POA: Diagnosis present

## 2023-11-21 HISTORY — PX: BUNIONECTOMY WITH HAMMERTOE RECONSTRUCTION AND GASTROC SLIDE: SHX5601

## 2023-11-21 HISTORY — PX: HALLUX VALGUS LAPIDUS: SHX6626

## 2023-11-21 HISTORY — PX: AIKEN OSTEOTOMY: SHX6331

## 2023-11-21 SURGERY — BUNIONECTOMY, LAPIDUS
Anesthesia: General | Site: Toe | Laterality: Right

## 2023-11-21 MED ORDER — LIDOCAINE HCL (CARDIAC) PF 100 MG/5ML IV SOSY
PREFILLED_SYRINGE | INTRAVENOUS | Status: DC | PRN
  Administered 2023-11-21: 80 mg via INTRAVENOUS

## 2023-11-21 MED ORDER — FENTANYL CITRATE (PF) 100 MCG/2ML IJ SOLN
INTRAMUSCULAR | Status: AC
  Filled 2023-11-21: qty 2

## 2023-11-21 MED ORDER — PROPOFOL 10 MG/ML IV BOLUS
INTRAVENOUS | Status: DC | PRN
  Administered 2023-11-21: 100 mg via INTRAVENOUS

## 2023-11-21 MED ORDER — CEFAZOLIN SODIUM-DEXTROSE 2-4 GM/100ML-% IV SOLN
INTRAVENOUS | Status: AC
  Filled 2023-11-21: qty 100

## 2023-11-21 MED ORDER — BUPIVACAINE HCL (PF) 0.5 % IJ SOLN
INTRAMUSCULAR | Status: DC | PRN
  Administered 2023-11-21: 10 mL via PERINEURAL

## 2023-11-21 MED ORDER — LACTATED RINGERS IV SOLN: INTRAVENOUS | Status: DC | PRN

## 2023-11-21 MED ORDER — ONDANSETRON HCL 4 MG/2ML IJ SOLN
INTRAMUSCULAR | Status: DC | PRN
  Administered 2023-11-21: 4 mg via INTRAVENOUS

## 2023-11-21 MED ORDER — BUPIVACAINE LIPOSOME 1.3 % IJ SUSP
INTRAMUSCULAR | Status: AC
  Filled 2023-11-21: qty 20

## 2023-11-21 MED ORDER — CHLORHEXIDINE GLUCONATE 0.12 % MT SOLN
OROMUCOSAL | Status: AC
  Filled 2023-11-21: qty 15

## 2023-11-21 MED ORDER — FENTANYL CITRATE PF 50 MCG/ML IJ SOSY
50.0000 ug | PREFILLED_SYRINGE | Freq: Once | INTRAMUSCULAR | Status: AC
  Administered 2023-11-21: 50 ug via INTRAVENOUS

## 2023-11-21 MED ORDER — MIDAZOLAM HCL 2 MG/2ML IJ SOLN
INTRAMUSCULAR | Status: AC
Start: 2023-11-21 — End: ?
  Filled 2023-11-21: qty 2

## 2023-11-21 MED ORDER — ONDANSETRON HCL 4 MG/2ML IJ SOLN
INTRAMUSCULAR | Status: AC
  Filled 2023-11-21: qty 2

## 2023-11-21 MED ORDER — 0.9 % SODIUM CHLORIDE (POUR BTL) OPTIME
TOPICAL | Status: DC | PRN
  Administered 2023-11-21: 500 mL

## 2023-11-21 MED ORDER — OXYCODONE-ACETAMINOPHEN 5-325 MG PO TABS: 1.0000 | ORAL_TABLET | ORAL | 0 refills | Status: AC | PRN

## 2023-11-21 MED ORDER — PROPOFOL 10 MG/ML IV BOLUS
INTRAVENOUS | Status: AC
  Filled 2023-11-21: qty 20

## 2023-11-21 MED ORDER — BUPIVACAINE HCL (PF) 0.5 % IJ SOLN
INTRAMUSCULAR | Status: AC
  Filled 2023-11-21: qty 10

## 2023-11-21 MED ORDER — DROPERIDOL 2.5 MG/ML IJ SOLN: 0.6250 mg | Freq: Once | INTRAMUSCULAR | Status: DC | PRN

## 2023-11-21 MED ORDER — LIDOCAINE HCL (PF) 2 % IJ SOLN
INTRAMUSCULAR | Status: AC
  Filled 2023-11-21: qty 5

## 2023-11-21 MED ORDER — LIDOCAINE HCL (PF) 1 % IJ SOLN
INTRAMUSCULAR | Status: DC | PRN
  Administered 2023-11-21: 3 mL via SUBCUTANEOUS

## 2023-11-21 MED ORDER — LIDOCAINE HCL (PF) 1 % IJ SOLN
INTRAMUSCULAR | Status: AC
  Filled 2023-11-21: qty 5

## 2023-11-21 MED ORDER — FENTANYL CITRATE PF 50 MCG/ML IJ SOSY
PREFILLED_SYRINGE | INTRAMUSCULAR | Status: AC
  Filled 2023-11-21: qty 1

## 2023-11-21 MED ORDER — BUPIVACAINE LIPOSOME 1.3 % IJ SUSP
INTRAMUSCULAR | Status: DC | PRN
  Administered 2023-11-21: 20 mL via PERINEURAL

## 2023-11-21 MED ORDER — FENTANYL CITRATE (PF) 100 MCG/2ML IJ SOLN: 25.0000 ug | INTRAMUSCULAR | Status: DC | PRN

## 2023-11-21 MED ORDER — IBUPROFEN 800 MG PO TABS: 800.0000 mg | ORAL_TABLET | Freq: Four times a day (QID) | ORAL | 1 refills | Status: AC | PRN

## 2023-11-21 MED ORDER — MIDAZOLAM HCL 2 MG/2ML IJ SOLN
1.0000 mg | INTRAMUSCULAR | Status: DC | PRN
  Administered 2023-11-21: 1 mg via INTRAVENOUS

## 2023-11-21 SURGICAL SUPPLY — 72 items
BLADE AVERAGE 25X9 (BLADE) ×2 IMPLANT
BLADE OSC/SAG .038X5.5 CUT EDG (BLADE) ×2 IMPLANT
BLADE SAW LAPIPLASTY 40X11 (BLADE) IMPLANT
BLADE SAW SAG 25X90X1.19 (BLADE) IMPLANT
BLADE SURG 15 STRL LF DISP TIS (BLADE) ×4 IMPLANT
BLADE SW THK.38XMED LNG THN (BLADE) IMPLANT
BNDG ELASTIC 3INX 5YD STR LF (GAUZE/BANDAGES/DRESSINGS) ×2 IMPLANT
BNDG ELASTIC 4INX 5YD STR LF (GAUZE/BANDAGES/DRESSINGS) ×2 IMPLANT
BNDG ELASTIC 4X5.8 VLCR NS LF (GAUZE/BANDAGES/DRESSINGS) ×2 IMPLANT
BNDG ELASTIC 6X5.8 VLCR NS LF (GAUZE/BANDAGES/DRESSINGS) ×2 IMPLANT
BNDG ESMARCH 4X12 STRL LF (GAUZE/BANDAGES/DRESSINGS) ×2 IMPLANT
BNDG GAUZE DERMACEA FLUFF 4 (GAUZE/BANDAGES/DRESSINGS) ×2 IMPLANT
CHLORAPREP W/TINT 26 (MISCELLANEOUS) ×2 IMPLANT
COVER LIGHT HANDLE STERIS (MISCELLANEOUS) ×4 IMPLANT
CUFF TOURN SGL QUICK 18X4 (TOURNIQUET CUFF) ×2 IMPLANT
DRAPE 3D C-ARM OEC (DRAPES) ×2 IMPLANT
DRAPE EXTREMITY 106X87X128.5 (DRAPES) ×2 IMPLANT
DRAPE FLUOR MINI C-ARM 54X84 (DRAPES) IMPLANT
DRAPE IMP U-DRAPE 54X76 (DRAPES) ×2 IMPLANT
DRAPE U-SHAPE 47X51 STRL (DRAPES) ×2 IMPLANT
DURAPREP 26ML APPLICATOR (WOUND CARE) ×2 IMPLANT
ELECT REM PT RETURN 9FT ADLT (ELECTROSURGICAL) ×2
ELECTRODE REM PT RTRN 9FT ADLT (ELECTROSURGICAL) ×2 IMPLANT
GAUZE 4X4 16PLY ~~LOC~~+RFID DBL (SPONGE) ×2 IMPLANT
GAUZE SPONGE 4X4 12PLY STRL (GAUZE/BANDAGES/DRESSINGS) ×2 IMPLANT
GAUZE STRETCH 2X75IN STRL (MISCELLANEOUS) ×2 IMPLANT
GAUZE XEROFORM 1X8 LF (GAUZE/BANDAGES/DRESSINGS) ×2 IMPLANT
GLOVE BIO SURGEON STRL SZ7.5 (GLOVE) ×2 IMPLANT
GLOVE SURG SYN 7.0 (GLOVE) ×2
GLOVE SURG SYN 7.0 PF PI (GLOVE) ×2 IMPLANT
GOWN STRL REUS W/ TWL LRG LVL3 (GOWN DISPOSABLE) ×4 IMPLANT
INST GUIDED SPEEDRELEASE (INSTRUMENTS) ×2
INST TRITOME TRIPLE EDGE REL (INSTRUMENTS) ×2
INSTRUMENT GUIDED SPEEDRELEASE (INSTRUMENTS) IMPLANT
INSTRUMENT TRITOM TRPL EDG REL (INSTRUMENTS) IMPLANT
KIT TURNOVER CYSTO (KITS) ×2 IMPLANT
KIT TURNOVER KIT A (KITS) ×2 IMPLANT
MANIFOLD NEPTUNE II (INSTRUMENTS) ×2 IMPLANT
NDL HYPO 25X1 1.5 SAFETY (NEEDLE) IMPLANT
NEEDLE HYPO 25X1 1.5 SAFETY (NEEDLE)
NS IRRIG 1000ML POUR BTL (IV SOLUTION) IMPLANT
NS IRRIG 500ML POUR BTL (IV SOLUTION) ×2 IMPLANT
PACK EXTREMITY ARMC (MISCELLANEOUS) ×2 IMPLANT
PAD CAST 4YDX4 CTTN HI CHSV (CAST SUPPLIES) ×2 IMPLANT
PAD PREP OB/GYN DISP 24X41 (PERSONAL CARE ITEMS) ×2 IMPLANT
PADDING CAST BLEND 4X4 NS (MISCELLANEOUS) ×8 IMPLANT
PENCIL SMOKE EVACUATOR (MISCELLANEOUS) ×2 IMPLANT
PLATE SPEED LAPIPLASTY (Plate) IMPLANT
PLATE SPEED LAPIPLASTY QUAD (Plate) IMPLANT
SPLINT CAST 1 STEP 4X30 (MISCELLANEOUS) ×2 IMPLANT
STOCKINETTE IMPERVIOUS 9X36 MD (GAUZE/BANDAGES/DRESSINGS) ×2 IMPLANT
STOCKINETTE STRL 6IN 960660 (GAUZE/BANDAGES/DRESSINGS) ×2 IMPLANT
SUCTION TUBE FRAZIER 10FR DISP (SUCTIONS) ×2 IMPLANT
SUT ETHILON 3 0 PS 1 (SUTURE) ×4 IMPLANT
SUT ETHILON 4-0 FS2 18XMFL BLK (SUTURE)
SUT MNCRL 3-0 UNDYED SH (SUTURE) IMPLANT
SUT MNCRL 4-018XMFL (SUTURE)
SUT MNCRL AB 3-0 PS2 27 (SUTURE) ×2 IMPLANT
SUT MNCRL AB 4-0 PS2 18 (SUTURE) ×2 IMPLANT
SUT MNCRL+ 5-0 UNDYED PC-3 (SUTURE) ×2 IMPLANT
SUT MON AB 5-0 P3 18 (SUTURE) IMPLANT
SUT VIC AB 0 CT1 27XCR 8 STRN (SUTURE) IMPLANT
SUT VIC AB 2-0 SH 27XBRD (SUTURE) IMPLANT
SUT VIC AB 3-0 SH 27X BRD (SUTURE) IMPLANT
SUT VIC AB 4-0 FS2 27 (SUTURE) ×2 IMPLANT
SUTURE ETHLN 4-0 FS2 18XMF BLK (SUTURE) IMPLANT
SUTURE MNCRL 4-018XMF (SUTURE) IMPLANT
SYR BULB EAR ULCER 3OZ GRN STR (SYRINGE) ×2 IMPLANT
TOWEL OR 17X26 4PK STRL BLUE (TOWEL DISPOSABLE) ×2 IMPLANT
TRAP FLUID SMOKE EVACUATOR (MISCELLANEOUS) ×2 IMPLANT
TUBING CONNECTOR 18X5MM (MISCELLANEOUS) ×2 IMPLANT
WATER STERILE IRR 500ML POUR (IV SOLUTION) ×2 IMPLANT

## 2023-11-21 NOTE — Transfer of Care (Signed)
Immediate Anesthesia Transfer of Care Note  Patient: Jasmine Franco  Procedure(s) Performed: HALLUX VALGUS LAPIDUS (Right: Foot) Quintella Reichert OSTEOTOMY (Right: Toe) BUNIONECTOMY WITH HAMMERTOE RECONSTRUCTION AND GASTROC SLIDE (Right: Foot)  Patient Location: PACU  Anesthesia Type:General  Level of Consciousness: drowsy  Airway & Oxygen Therapy: Patient Spontanous Breathing and Patient connected to face mask oxygen  Post-op Assessment: Report given to RN and Post -op Vital signs reviewed and stable  Post vital signs: Reviewed and stable  Last Vitals:  Vitals Value Taken Time  BP    Temp    Pulse    Resp    SpO2      Last Pain:  Vitals:   11/21/23 0939  PainSc: 0-No pain         Complications: No notable events documented.

## 2023-11-21 NOTE — Anesthesia Preprocedure Evaluation (Signed)
Anesthesia Evaluation  Patient identified by MRN, date of birth, ID band Patient awake    Reviewed: Allergy & Precautions, NPO status , Patient's Chart, lab work & pertinent test results  History of Anesthesia Complications Negative for: history of anesthetic complications  Airway Mallampati: III  TM Distance: <3 FB Neck ROM: full    Dental  (+) Chipped, Dental Advidsory Given   Pulmonary neg pulmonary ROS, neg shortness of breath, neg COPD, neg recent URI   Pulmonary exam normal breath sounds clear to auscultation       Cardiovascular Exercise Tolerance: Good hypertension, (-) angina (-) Past MI negative cardio ROS Normal cardiovascular exam Rhythm:Regular Rate:Normal     Neuro/Psych  PSYCHIATRIC DISORDERS  Depression    negative neurological ROS     GI/Hepatic Neg liver ROS,GERD  ,,  Endo/Other  negative endocrine ROS    Renal/GU Renal disease     Musculoskeletal   Abdominal   Peds  Hematology negative hematology ROS (+)   Anesthesia Other Findings Past Medical History: No date: Cancer (HCC) No date: Dyspnea  Past Surgical History: No date: CHOLECYSTECTOMY  BMI    Body Mass Index: 41.74 kg/m      Reproductive/Obstetrics negative OB ROS                             Anesthesia Physical Anesthesia Plan  ASA: 3  Anesthesia Plan: General   Post-op Pain Management: GA combined w/ Regional for post-op pain   Induction: Intravenous  PONV Risk Score and Plan: 3 and Ondansetron, Dexamethasone, Midazolam and Treatment may vary due to age or medical condition  Airway Management Planned: LMA  Additional Equipment: None  Intra-op Plan:   Post-operative Plan: Extubation in OR  Informed Consent: I have reviewed the patients History and Physical, chart, labs and discussed the procedure including the risks, benefits and alternatives for the proposed anesthesia with the patient or  authorized representative who has indicated his/her understanding and acceptance.     Dental Advisory Given  Plan Discussed with: Anesthesiologist, CRNA and Surgeon  Anesthesia Plan Comments: (Discussed risks of anesthesia with patient, including PONV, sore throat, lip/dental damage. Rare risks discussed as well, such as cardiorespiratory and neurological sequelae, and allergic reactions. Patient understands. Discussed r/b/a of interscalene block, including elective nature. Patient did not have an issue last time with her block, does not remember how long it lasted. Risks discussed: - Rare: bleeding, infection, nerve damage - shortness of breath from hemidiaphragmatic paralysis - unilateral horner's syndrome - poor/non-working blocks Patient understands and agrees. )        Anesthesia Quick Evaluation

## 2023-11-21 NOTE — Discharge Instructions (Addendum)
After Surgery Instructions   1) If you are recuperating from surgery anywhere other than home, please be sure to leave [place] the number where you can be reached.  2) Go directly home and rest.  3) Keep the operated foot(feet) elevated six inches above the hip when sitting or lying down. This will help control swelling and pain.  4) Support the elevated foot and leg with pillows. DO NOT PLACE PILLOWS UNDER THE KNEE.  5) DO NOT REMOVE or get your bandages WET, unless you were given different instructions by your doctor to do so. This increases the risk of infection.  6) Wear your surgical shoe or surgical boot at all times when you are up on your feet.  7) A limited amount of pain and swelling may occur. The skin may take on a bruised appearance. DO NOT BE ALARMED, THIS IS NORMAL.  8) For slight pain and swelling, apply an ice pack directly over the bandages for 15 minutes only out of each hour of the day. Continue until seen in the office for your first post op visit. DON NOT     APPLY ANY FORM OF HEAT TO THE AREA.  9) Have prescriptions filled immediately and take as directed.  10) Drink lots of liquids, water and juice to stay hydrated.  11) CALL IMMEDIATELY IF:  *Bleeding continues until the following day of surgery  *Pain increases and/or does not respond to medication  *Bandages or cast appears to tight  *If your bandage gets wet  *Trip, fall or stump your surgical foot  *If your temperature goes above 101  *If you have ANY questions at all  YOU NOW CONTROL THE EFFORT OF YOUR RECOVERY. ADHERING TO THESE INSTRUCTIONS WILL OFFER YOU THE MOST COMPLETE RESULTS

## 2023-11-21 NOTE — Anesthesia Procedure Notes (Signed)
Procedure Name: LMA Insertion Date/Time: 11/21/2023 10:52 AM  Performed by: Hezzie Bump, CRNAPre-anesthesia Checklist: Patient identified, Patient being monitored, Timeout performed, Emergency Drugs available and Suction available Patient Re-evaluated:Patient Re-evaluated prior to induction Oxygen Delivery Method: Circle system utilized Preoxygenation: Pre-oxygenation with 100% oxygen Induction Type: IV induction Ventilation: Mask ventilation without difficulty LMA: LMA inserted and LMA with gastric port inserted LMA Size: 4.0 Tube type: Oral Number of attempts: 1 Placement Confirmation: positive ETCO2 and breath sounds checked- equal and bilateral Tube secured with: Tape Dental Injury: Teeth and Oropharynx as per pre-operative assessment

## 2023-11-21 NOTE — Anesthesia Procedure Notes (Signed)
Anesthesia Regional Block: Popliteal block   Pre-Anesthetic Checklist: , timeout performed,  Correct Patient, Correct Site, Correct Laterality,  Correct Procedure, Correct Position, site marked,  Risks and benefits discussed,  Surgical consent,  Pre-op evaluation,  At surgeon's request and post-op pain management  Laterality: Lower and Right  Prep: chloraprep       Needles:  Injection technique: Single-shot  Needle Type: Echogenic Needle     Needle Length: 9cm  Needle Gauge: 21     Additional Needles:   Procedures:,,,, ultrasound used (permanent image in chart),,    Narrative:  Start time: 11/21/2023 10:37 AM End time: 11/21/2023 10:39 AM Injection made incrementally with aspirations every 5 mL.  Performed by: Personally  Anesthesiologist: Lenard Simmer, MD  Additional Notes: Patient consented for risk and benefits of nerve block including but not limited to nerve damage, failed block, bleeding and infection.  Patient voiced understanding.  Functioning IV was confirmed and monitors were applied.  Timeout done prior to procedure and prior to any sedation being given to the patient.  Patient confirmed procedure site prior to any sedation given to the patient.  A 50mm 22ga Stimuplex needle was used. Sterile prep,hand hygiene and sterile gloves were used.  Minimal sedation used for procedure.  No paresthesia endorsed by patient during the procedure.  Negative aspiration and negative test dose prior to incremental administration of local anesthetic. The patient tolerated the procedure well with no immediate complications.

## 2023-11-22 NOTE — Op Note (Addendum)
Surgeon: Surgeon(s): Candelaria Stagers, DPM  Assistants: None Pre-operative diagnosis: BUNION HAMMERTOE  Post-operative diagnosis: same Procedure: Right Lapidus bunionectomy Pathology: * No specimens in log *  Pertinent Intra-op findings:  Anesthesia: General  Hemostasis:  Total Tourniquet Time Documented: Calf (Right) - 52 minutes Total: Calf (Right) - 52 minutes  EBL: 2 mL  Materials: Treace medical lapiplasty with 3-0 Monocryl 4-0 Monocryl 5-0 Monocryl Injectables: None Complications: None  Indications for surgery: A 57 y.o. female presents with right painful Lapidus bunionectomy. Patient has failed all conservative therapy including but not limited to none. She wishes to have surgical correction of the foot/deformity. It was determined that patient would benefit from right Lapidus bunionectomy with first tarsometatarsal fusion. Informed surgical risk consent was reviewed and read aloud to the patient.  I reviewed the films.  I have discussed my findings with the patient in great detail.  I have discussed all risks including but not limited to infection, stiffness, scarring, limp, disability, deformity, damage to blood vessels and nerves, numbness, poor healing, need for braces, arthritis, chronic pain, amputation, death.  All benefits and realistic expectations discussed in great detail.  I have made no promises as to the outcome.  I have provided realistic expectations.  I have offered the patient a 2nd opinion, which they have declined and assured me they preferred to proceed despite the risks   Procedure in detail: The patient was both verbally and visually identified by myself, the nursing staff, and anesthesia staff in the preoperative holding area. They were then transferred to the operating room and placed on the operative table in supine position.  Patient is brought to the operating room and placed left on table in supine position well-padded ankle  tourniquet is applied to the  left calf. Anesthesia is achievedpopliteal and saphenous nerve block with MAC. The left extremity was then scrubbed prepped and draped in normal sterile customary fashion. Upon  exsanguination of the foot the calf tourniquet was inflated to 250 mm of mercury. Attention is then  directed at the 1st metatarsocuneiform joint where an incision is initiated at the proximal aspect of the  medial cuneiform down to the level of the mid shaft of the 1st metatarsal. A #15 Blade was then used  to deepen the incision through the subcutaneous tissues down the level of the periosteum. The  periosteum is then reflected at the 1st metatarsal and metatarsocuneiform joint and medial cuneiform.  All superficial bleeders were identified and bovied care was taken to retract the neurovascular  structures medial. At this point the 40 mm saw blade is used to reciprocally plane the  metatarsocuneiform joint a osteotome was then used to free the plantar ligaments, this is to allow  mobilization of the 1st metatarsal cuneiform joint in order to free and correct the frontal plane motion  with this bunion deformity. At this point a Glorious Peach is used to create a pocket at the base laterally of the  1st metatarsal. At this point a short K-wire is then placed at the base of the 1st metatarsal positioned  towards the 5th metatarsal head to the lateral cortex of the 1st metatarsal this joy stick is then used to  correct the frontal plane motion. At this point inspection under fluoroscopy shows that the sesamoids  cannot fully rotate under the 1st metatarsal head.   At this point a linear incision is placed in the  right 1st interspace 15. Blade was used to deepen incision down through the subcutaneous tissue.  Metzenbaum scissors were used to bluntly dissect to the lateral capsule of the right foot. A 15. Blade is  then used to create a lateral capsulotomy and utilizing a J stroke both proximally and laterally the  suspensory  sesamoidal ligament is released manipulation of the 1st metatarsophalangeal joint shows  good motion and adequate release of the lateral capsule. At this point the is joystick is then used to put  the 1st metatarsal through frontal plane motion and the sesamoids are observed to be placed back in its  normal anatomical position. At this point the 3.5 mm fulcrum is placed at the 1st metatarsal lateral base. A stab incision is then  placed at the dorsal aspect of the second metatarsal to allow placement of the the C-clamp. This is then  placed at the lateral 2nd metatarsal shaft and just distal to the medial ridge of the 1st metatarsal. The  C-clamp is then tightened to correct the intermetatarsal angle. At the same time the joystick is used to  rotate the 1st metatarsal and the right hallux was dorsiflexed to maintain the metatarsal in the sagittal  plane. Verification on the C-arm shows good correction of the bunion deformity in all 3 planes. A  smooth K-wire is then driven through the clamp to hold the corrected anatomic position. The speed  seeker is then placed in the joint and lateral aspect of the first metatarsal. At this point the gold cut  guide is placed over the speed seeker verification on the C-arm shows good placement of the cut guide  in line with the bisection of the 1st metatarsal. This was then secured in place with 3 short pins at this  point the saw blade was then used to make the cuneiform cut followed by the 1st metatarsal base cut.  The cuts are made through and through the plantar cortex. At this point an osteotome was used to free  the plantar ligaments. The bone fragments are then removed in toto. At this point the joint distractor is  placed over the short pins further inspection is made to ensure no plantar bone is remaining. The  wound is then flushed copious amounts sterile saline. Utilizing a 2.0 drill fenestration of the osteotomy  site was achieved on the 1st  metatarsal and medial cuneiform. The at this point the fulcrum is placed on  the lateral aspect. In the osteotomy is closed. Verification on the C-arm shows no elevation of the 1st metatarsal and good approximation of the osteotomy. At this point a 2.0 olive wire is used to  provisionally fixate the osteotomy. This was placed from the lateral aspect of the 1st metatarsal across  the medial cuneiform medially. A smooth K-wires and driven from medial to lateral. At this point the  distractor is removed and the short pins are removed also. Attention then directed at the dorsal lateral wound where a joint where a biplanar plate is placed at the lateral most aspect of the metatarsocuneiform joint  verification on the C-arm showed good placement and no violation of the inter cuneiform joint laterally.  2 cross-links.  Staples were applied at 90 degrees from each other in good alignment.  This was applied in standard technique. Verification on the C-arm showed good placement of the plates which is at a  90 degree construct. Delete and flushsterile saline the periosteal and subcutaneous  tissues were closed with 3-0 Vicryl. Prior to closure of the deeper tissue. Closure of the skin is achieved utilizing 3-0  Prolene with horizontal mattress  sutures. The 1st interspace incision is also closed with 3-0 Prolene as is the 2nd metatarsal incision.  Final radiographs show good anatomic reduction of the previous bunion deformity with good placement  of the plates.  the  wound is dressed with Adaptic 4x4s and Kerlix. An Ace wrap was then applied around the foot. The calf  tourniquet is then deflated and color returned to the digits of the right foot x5. At this point a posterior  splint is applied. The patien the operating room with vital signs stable and neurovascular status intact.  At the conclusion of the procedure the patient was awoken from anesthesia and found to have tolerated the procedure well any  complications. There were transferred to PACU with vital signs stable and vascular status intact.  Nicholes Rough, DPM

## 2023-11-28 NOTE — Anesthesia Postprocedure Evaluation (Signed)
Anesthesia Post Note  Patient: Jasmine Franco  Procedure(s) Performed: HALLUX VALGUS LAPIDUS (Right: Foot) Quintella Reichert OSTEOTOMY (Right: Toe) BUNIONECTOMY WITH HAMMERTOE RECONSTRUCTION AND GASTROC SLIDE (Right: Foot)  Patient location during evaluation: PACU Anesthesia Type: General Level of consciousness: awake and alert Pain management: pain level controlled Vital Signs Assessment: post-procedure vital signs reviewed and stable Respiratory status: spontaneous breathing, nonlabored ventilation, respiratory function stable and patient connected to nasal cannula oxygen Cardiovascular status: blood pressure returned to baseline and stable Postop Assessment: no apparent nausea or vomiting Anesthetic complications: no   No notable events documented.   Last Vitals:  Vitals:   11/21/23 1245 11/21/23 1324  BP: 128/62 136/70  Pulse: (!) 56 62  Resp: 15 16  Temp: 36.6 C (!) 36.2 C  SpO2: 97% 99%    Last Pain:  Vitals:   11/22/23 0914  TempSrc:   PainSc: 0-No pain                 Lenard Simmer

## 2023-11-29 ENCOUNTER — Ambulatory Visit (INDEPENDENT_AMBULATORY_CARE_PROVIDER_SITE_OTHER): Payer: BC Managed Care – PPO | Admitting: Podiatry

## 2023-11-29 ENCOUNTER — Encounter: Payer: BC Managed Care – PPO | Admitting: Podiatry

## 2023-11-29 ENCOUNTER — Encounter: Payer: Self-pay | Admitting: Podiatry

## 2023-11-29 ENCOUNTER — Ambulatory Visit (INDEPENDENT_AMBULATORY_CARE_PROVIDER_SITE_OTHER): Payer: BC Managed Care – PPO

## 2023-11-29 VITALS — Ht 64.0 in | Wt 166.0 lb

## 2023-11-29 DIAGNOSIS — M2011 Hallux valgus (acquired), right foot: Secondary | ICD-10-CM

## 2023-11-29 DIAGNOSIS — Z9889 Other specified postprocedural states: Secondary | ICD-10-CM

## 2023-11-29 NOTE — Progress Notes (Unsigned)
 Healing fine

## 2023-12-13 ENCOUNTER — Ambulatory Visit (INDEPENDENT_AMBULATORY_CARE_PROVIDER_SITE_OTHER): Payer: BC Managed Care – PPO | Admitting: Podiatry

## 2023-12-13 ENCOUNTER — Encounter: Payer: Self-pay | Admitting: Podiatry

## 2023-12-13 VITALS — Ht 64.0 in | Wt 166.0 lb

## 2023-12-13 DIAGNOSIS — Z9889 Other specified postprocedural states: Secondary | ICD-10-CM

## 2023-12-13 DIAGNOSIS — M205X1 Other deformities of toe(s) (acquired), right foot: Secondary | ICD-10-CM

## 2023-12-13 NOTE — Progress Notes (Signed)
Subjective:  Patient ID: Jasmine Franco, female    DOB: [birth year],  MRN: [id number]  Chief Complaint  Patient presents with   Routine Post Op    Pt is here for her 2nd post op visit since surgery. Pt states the first week after surgery was fine but the second week she has had a lot of pain.    DOS: 11/21/2023 Procedure: Right Lapidus bunionectomy  57 y.o. female returns for post-op check.  Patient states she is doing well.  Denies any other acute complaints.  She has been slowly weightbearing as tolerated with the cam boot.  She would like to discuss doing a flexor tenotomy of the fourth and fifth digit.  She states causing her some pain and there is distal callus forming.  Review of Systems: Negative except as noted in the HPI. Denies N/V/F/Ch.  Past Medical History:  Diagnosis Date   Bunion of right foot 10/2023   Cancer (HCC) 2022   skin cancer on forehead   Cholelithiasis    Diuretic-induced hypokalemia 09/2023   Dyspnea    Essential hypertension    Excess skin of arm    bilateral arms since weight loss   GERD (gastroesophageal reflux disease)    Hammertoe of right foot 10/2023   4th and 5th digit   History of kidney stones    Hyperlipidemia    OAB (overactive bladder)    Obesity    Plantar fasciitis, bilateral    Pre-diabetes    Thrombocytopenia (HCC)    Vitamin D deficiency     Current Outpatient Medications:    buPROPion (WELLBUTRIN XL) 300 MG 24 hr tablet, Take 300 mg by mouth at bedtime., Disp: , Rfl:    cephALEXin (KEFLEX) 500 MG capsule, Take 1 capsule (500 mg total) by mouth 3 (three) times daily., Disp: 30 capsule, Rfl: 0   clobetasol ointment (TEMOVATE) 0.05 %, Apply 1 Application topically 2 (two) times daily as needed (rash)., Disp: , Rfl:    ergocalciferol (VITAMIN D2) 1.25 MG (50000 UT) capsule, Take 50,000 Units by mouth every Wednesday., Disp: , Rfl:    hydrochlorothiazide (MICROZIDE) 12.5 MG capsule, Take 12.5 mg by mouth at bedtime., Disp: , Rfl:     ibuprofen (ADVIL) 800 MG tablet, Take 1 tablet (800 mg total) by mouth every 6 (six) hours as needed., Disp: 60 tablet, Rfl: 1   omeprazole (PRILOSEC) 20 MG capsule, Take 20 mg by mouth at bedtime., Disp: , Rfl:    ondansetron (ZOFRAN) 4 MG tablet, Take 1 tablet (4 mg total) by mouth every 8 (eight) hours as needed., Disp: 20 tablet, Rfl: 0   oxyCODONE-acetaminophen (PERCOCET) 10-325 MG tablet, Take 1 tablet by mouth every 8 (eight) hours as needed for pain., Disp: , Rfl:    oxyCODONE-acetaminophen (PERCOCET) 5-325 MG tablet, Take 1 tablet by mouth every 4 (four) hours as needed for severe pain (pain score 7-10)., Disp: 30 tablet, Rfl: 0   solifenacin (VESICARE) 10 MG tablet, Take 10 mg by mouth at bedtime., Disp: , Rfl:   Social History   Tobacco Use  Smoking Status Never  Smokeless Tobacco Never    Allergies  Allergen Reactions   Cortisone Other (See Comments)    redness   Silicone Rash    Steri-strips postoperatively led to raised, punctate reddish lesions with mild drainage. Burning sensation    Tape Rash    Steri-strips postoperatively led to raised, punctate reddish lesions with mild drainage. Burning sensation   Objective:  There were no  vitals filed for this visit. Body mass index is 28.49 kg/m. Constitutional Well developed. Well nourished.  Vascular Foot warm and well perfused. Capillary refill normal to all digits.   Neurologic Normal speech. Oriented to person, place, and time. Epicritic sensation to light touch grossly present bilaterally.  Dermatologic Skin healing well without signs of infection. Skin edges well coapted without signs of infection. Pre-ulcerative callus at the tip of the right, 4th toe, 5th toe Semi-reducible hammertoe deformity right, 4th toe, 5th toe   Orthopedic: Tenderness to palpation noted about the surgical site.   Radiographs: 3 views of skeletally mature the right foot: Reduction of bunion deformity noted.  Sesamoid position reduced.   Hardware is intact Assessment:   No diagnosis found.  Plan:  Patient was evaluated and treated and all questions answered.  S/p foot surgery right -Progressing as expected post-operatively. -XR: See above -WB Status: Nonweightbearing in right lower extremity with crutches -Sutures: Intact.  No signs of dehiscence noted no complication noted. -Medications: None -Foot redressed.  Hammertoe right fourth and fifth with pre-ulcerative callus -Flexor tenotomy as below. -Advised to remove the dressing in 24 hours and apply a band-aid and triple abx ointment every day thereafter.  Procedure: Flexor Tenotomy Indication for Procedure: toe with semi-reducible hammertoe with distal tip ulceration. Flexor tenotomy indicated to alleviate contracture, reduce pressure, and enhance healing of the ulceration. Location: right, 4th toe, 5th toe Anesthesia: Lidocaine 1% plain; 1.5 mL and Marcaine 0.5% plain; 1.5 mL digital block Instrumentation: 18 g needle  Technique: The toe was anesthetized as above and prepped in the usual fashion. The toe was exsanquinated and a tourniquet was secured at the base of the toe. An 18g needle was then used to percutaneously release the flexor tendon at the plantar surface of the toe with noted release of the hammertoe deformity. The incision was then dressed with antibiotic ointment and band-aid. Compression splint dressing applied. Patient tolerated the procedure well. Dressing: Dry, sterile, compression dressing. Disposition: Patient tolerated procedure well. Patient to return in 1 week for follow-up.      No follow-ups on file.     No follow-ups on file.

## 2023-12-15 ENCOUNTER — Encounter: Payer: Self-pay | Admitting: *Deleted

## 2023-12-16 ENCOUNTER — Telehealth: Payer: Self-pay | Admitting: Podiatry

## 2023-12-16 NOTE — Telephone Encounter (Signed)
Patient had surgery on 11/21/23.  States her foot turns purple when it is not elevated.  Needs a work note to return 01/02/24.

## 2023-12-27 ENCOUNTER — Encounter: Payer: BC Managed Care – PPO | Admitting: Podiatry

## 2024-01-02 ENCOUNTER — Encounter: Payer: Self-pay | Admitting: Podiatry

## 2024-01-02 ENCOUNTER — Ambulatory Visit (INDEPENDENT_AMBULATORY_CARE_PROVIDER_SITE_OTHER): Payer: BC Managed Care – PPO | Admitting: Podiatry

## 2024-01-02 ENCOUNTER — Ambulatory Visit (INDEPENDENT_AMBULATORY_CARE_PROVIDER_SITE_OTHER): Payer: BC Managed Care – PPO

## 2024-01-02 DIAGNOSIS — M2011 Hallux valgus (acquired), right foot: Secondary | ICD-10-CM

## 2024-01-02 DIAGNOSIS — M205X1 Other deformities of toe(s) (acquired), right foot: Secondary | ICD-10-CM

## 2024-01-02 DIAGNOSIS — Z9889 Other specified postprocedural states: Secondary | ICD-10-CM

## 2024-01-02 NOTE — Progress Notes (Signed)
  Subjective:  Patient ID: Jasmine Franco, female    DOB: 02-11-1966,  MRN: [id number]  Chief Complaint  Patient presents with   Routine Post Op    POV  DOS 11/21/23 --- RIGHT 4TH/5TH HAMMERTOE ARTHROPLASTY WITH LAPIDUS BUNIONECTOMY WITH FIXATION WITH POSSIBLE PHALANGEAL OSTEOTOMY POV#1  DOS 12/13/2023 flexor tenotomy on 4th/5th  It's still swollen and tingling.    58 y.o. female returns for post-op check.   Review of Systems: Negative except as noted in the HPI. Denies N/V/F/Ch.   Objective:  There were no vitals filed for this visit. There is no height or weight on file to calculate BMI. Constitutional Well developed. Well nourished.  Vascular Foot warm and well perfused. Capillary refill normal to all digits.  Calf is soft and supple, no posterior calf or knee pain, negative Homans' sign  Neurologic Normal speech. Oriented to person, place, and time. Epicritic sensation to light touch grossly present bilaterally.  Dermatologic Skin healing well without signs of infection. Skin edges well coapted without signs of infection.  Orthopedic: Tenderness to palpation noted about the surgical site.  Moderate edema   Multiple view plain film radiographs: Radiographs taken today show continued visibility of fusion site Assessment:   1. Status post foot surgery   2. Acquired hallux valgus of right foot   3. Toe contracture, right    Plan:  Patient was evaluated and treated and all questions answered.  S/p foot surgery right -Progressing as expected post-operatively. -XR: Noted above.  Still has visible fusion site.  I recommended continuing protected weightbearing in the cam boot she is currently 6 weeks out from her original surgery.  Follow-up with Dr. Tobie in 4 weeks for new radiographs\ -Tenotomy sutures removed uneventfully   Return in about 4 weeks (around 01/30/2024) for post op (new x-rays).

## 2024-01-31 ENCOUNTER — Encounter: Payer: BC Managed Care – PPO | Admitting: Podiatry

## 2024-02-09 ENCOUNTER — Ambulatory Visit: Payer: BLUE CROSS/BLUE SHIELD | Admitting: Podiatry

## 2024-02-09 ENCOUNTER — Encounter: Payer: Self-pay | Admitting: Podiatry

## 2024-02-09 ENCOUNTER — Ambulatory Visit (INDEPENDENT_AMBULATORY_CARE_PROVIDER_SITE_OTHER): Payer: BLUE CROSS/BLUE SHIELD

## 2024-02-09 DIAGNOSIS — Z9889 Other specified postprocedural states: Secondary | ICD-10-CM

## 2024-02-09 DIAGNOSIS — M2011 Hallux valgus (acquired), right foot: Secondary | ICD-10-CM

## 2024-02-09 NOTE — Progress Notes (Signed)
  Subjective:  Patient ID: Jasmine Franco, female    DOB: 1966-01-16,  MRN: [id number]  Chief Complaint  Patient presents with   Routine Post Op     POV  DOS 11/21/23 --- RIGHT 4TH/5TH HAMMERTOE ARTHROPLASTY WITH LAPIDUS BUNIONECTOMY WITH FIXATION WITH POSSIBLE PHALANGEAL OSTEOTOMY POV#2  DOS 12/13/2023 flexor tenotomy on 4th/5th "It's okay, it tingles a lot."    58 y.o. female returns for post-op check.   Review of Systems: Negative except as noted in the HPI. Denies N/V/F/Ch.   Objective:  There were no vitals filed for this visit. There is no height or weight on file to calculate BMI. Constitutional Well developed. Well nourished.  Vascular Foot warm and well perfused. Capillary refill normal to all digits.  Calf is soft and supple, no posterior calf or knee pain, negative Homans' sign  Neurologic Normal speech. Oriented to person, place, and time. Epicritic sensation to light touch grossly present bilaterally.  Dermatologic Skin complaint epithelialized no signs of Deis is noted no complication noted.  Reduction of bunion deformity noted.  Orthopedic: Tenderness to palpation noted about the surgical site.  Moderate edema   Multiple view plain film radiographs: Radiographs taken today show continued visibility of fusion site Assessment:   1. Status post foot surgery    Plan:  Patient was evaluated and treated and all questions answered.  S/p foot surgery right with concern for nonunion of the arthrodesis -Progressing as expected post-operatively. -XR: Reviewed there is still increased and nonunion.  At this time I believe patient would benefit from a bone stimulator.  Will work on getting her a bone stimulator to help with the arthrodesis of first tarsometatarsal joint -At this time if it does not clinically improve patient will need a revisional surgery at the first tarsometatarsal joint. -Clinically hardware appears to be intact does not appear to be backing out or  loosening  No follow-ups on file.

## 2024-02-28 ENCOUNTER — Telehealth: Payer: Self-pay | Admitting: Podiatry

## 2024-02-28 NOTE — Telephone Encounter (Signed)
 Patient is still experiencing pain in right foot after three weeks and would like to speak with Dr. Allena Katz or nurse regarding this issue. Please contact patient at [phone]

## 2024-04-10 ENCOUNTER — Ambulatory Visit: Payer: BLUE CROSS/BLUE SHIELD | Admitting: Podiatry

## 2024-04-16 ENCOUNTER — Ambulatory Visit
Admission: EM | Admit: 2024-04-16 | Discharge: 2024-04-16 | Disposition: A | Discharge status: Home / Self Care | Attending: Emergency Medicine | Admitting: Emergency Medicine

## 2024-04-16 ENCOUNTER — Encounter: Payer: Self-pay | Admitting: Emergency Medicine

## 2024-04-16 ENCOUNTER — Other Ambulatory Visit: Payer: Self-pay

## 2024-04-16 DIAGNOSIS — N3001 Acute cystitis with hematuria: Secondary | ICD-10-CM | POA: Insufficient documentation

## 2024-04-16 DIAGNOSIS — R300 Dysuria: Secondary | ICD-10-CM | POA: Insufficient documentation

## 2024-04-16 LAB — POCT URINALYSIS DIP (MANUAL ENTRY)
Bilirubin, UA: NEGATIVE
Glucose, UA: 100 mg/dL — AB
Ketones, POC UA: NEGATIVE mg/dL
Nitrite, UA: POSITIVE — AB
Protein Ur, POC: NEGATIVE mg/dL
Spec Grav, UA: 1.015
Urobilinogen, UA: 1 U/dL
pH, UA: 5

## 2024-04-16 MED ORDER — NITROFURANTOIN MONOHYD MACRO 100 MG PO CAPS: 100.0000 mg | ORAL_CAPSULE | Freq: Two times a day (BID) | ORAL | 0 refills | Status: AC

## 2024-04-16 NOTE — Discharge Instructions (Addendum)
 Your urinalysis shows Jasmine Franco blood cells and nitrates which are indicative of infection, your urine will be sent to the lab to determine exactly which bacteria is present, if any changes need to be made to your medications you will be notified  Swab checking for yeast and BV is pending 2 to 3 days, you will be notified of positive results only and at that time medication sent to pharmacy  Begin use of macrobid  twice daily for 5 days   You may use over-the-counter Azo to help minimize your symptoms until antibiotic removes bacteria, this medication will turn your urine orange  Increase your fluid intake through use of water  As always practice good hygiene, wiping front to back and avoidance of scented vaginal products to prevent further irritation  If symptoms continue to persist after use of medication or recur please follow-up with urgent care or your primary doctor as needed

## 2024-04-16 NOTE — ED Provider Notes (Signed)
 [name]    CSN: [id number] Arrival date & time: 04/16/24  0825      History   Chief Complaint Chief Complaint  Patient presents with   Dysuria    HPI Jasmine Franco is a 58 y.o. female.   Patient presents for evaluation of urinary frequency, dysuria, foul urinary odor, hematuria beginning 1 day ago.  Has attempted use of Azo which was helpful.  Denies abdominal or flank pain, fever or vaginal symptoms.  Past Medical History:  Diagnosis Date   Bunion of right foot 10/2023   Cancer (HCC) 2022   skin cancer on forehead   Cholelithiasis    Diuretic-induced hypokalemia 09/2023   Dyspnea    Essential hypertension    Excess skin of arm    bilateral arms since weight loss   GERD (gastroesophageal reflux disease)    Hammertoe of right foot 10/2023   4th and 5th digit   History of kidney stones    Hyperlipidemia    OAB (overactive bladder)    Obesity    Plantar fasciitis, bilateral    Pre-diabetes    Thrombocytopenia (HCC)    Vitamin D deficiency     Patient Active Problem List   Diagnosis Date Noted   Visual disturbance    Cough    AKI (acute kidney injury) (HCC)    Klebsiella sepsis (HCC)    Lactic acidosis    Thrombocytopenia (HCC)    Depression    Pyelonephritis 05/22/2021   Severe sepsis (HCC) 05/22/2021   Obesity, Class III, BMI 40-49.9 (morbid obesity) (HCC) 05/22/2021   Acute cholecystitis 02/11/2021   Symptomatic cholelithiasis 02/10/2021   Cholecystitis    Bilateral low back pain without sciatica 12/08/2020   OAB (overactive bladder) 12/08/2020    Past Surgical History:  Procedure Laterality Date   Candida Chalk OSTEOTOMY Right 11/21/2023   Procedure: Candida Chalk OSTEOTOMY;  Surgeon: Velma Ghazi, DPM;  Location: [facility];  Service: Orthopedics/Podiatry;  Laterality: Right;   BUNIONECTOMY WITH HAMMERTOE RECONSTRUCTION AND GASTROC SLIDE Right 11/21/2023   Procedure: BUNIONECTOMY WITH HAMMERTOE RECONSTRUCTION AND GASTROC SLIDE;  Surgeon: Velma Ghazi, DPM;  Location: [facility];  Service: Orthopedics/Podiatry;  Laterality: Right;   CHOLECYSTECTOMY  01/2021   COLONOSCOPY WITH PROPOFOL  N/A 11/10/2021   Procedure: COLONOSCOPY WITH PROPOFOL ;  Surgeon: Shane Darling, MD;  Location: [facility];  Service: Endoscopy;  Laterality: N/A;   ESOPHAGOGASTRODUODENOSCOPY (EGD) WITH PROPOFOL  N/A 11/10/2021   Procedure: ESOPHAGOGASTRODUODENOSCOPY (EGD) WITH PROPOFOL ;  Surgeon: Shane Darling, MD;  Location: [facility];  Service: Endoscopy;  Laterality: N/A;   HALLUX VALGUS LAPIDUS Right 11/21/2023   Procedure: HALLUX VALGUS LAPIDUS;  Surgeon: Velma Ghazi, DPM;  Location: [facility];  Service: Orthopedics/Podiatry;  Laterality: Right;  POPLITEAL BLOCK   PRE-MALIGNANT / BENIGN SKIN LESION EXCISION     REVERSE SHOULDER ARTHROPLASTY Left 06/22/2021   Procedure: REVERSE SHOULDER ARTHROPLASTY, biceps tenodesis - RNFA needed;  Surgeon: Lorri Rota, MD;  Location: [facility];  Service: Orthopedics;  Laterality: Left;   SHOULDER ARTHROSCOPY WITH SUBACROMIAL DECOMPRESSION AND OPEN ROTATOR C Right 10/05/2021   Procedure: Right shoulder arthroscopic  rotator cuff repair (subscapularis & supraspinatus) with subacromial decompression, and athroscopic biceps tenodesis;  Surgeon: Lorri Rota, MD;  Location: [facility];  Service: Orthopedics;  Laterality: Right;    OB History   No obstetric history on file.      Home Medications    Prior to Admission medications   Medication Sig Start Date End Date Taking? Authorizing  Provider  nitrofurantoin , macrocrystal-monohydrate, (MACROBID ) 100 MG capsule Take 1 capsule (100 mg total) by mouth 2 (two) times daily. 04/16/24  Yes Ashrita Chrismer R, NP  buPROPion (WELLBUTRIN XL) 300 MG 24 hr tablet Take 300 mg by mouth at bedtime. 04/04/22   [provider]  cephALEXin  (KEFLEX ) 500 MG capsule Take 1 capsule (500 mg total) by mouth 3 (three) times daily. Patient not taking: Reported on 01/02/2024 10/06/23    Hyatt, Max T, DPM  clobetasol ointment (TEMOVATE) 0.05 % Apply 1 Application topically 2 (two) times daily as needed (rash). 09/09/23   [provider]  ergocalciferol (VITAMIN D2) 1.25 MG (50000 UT) capsule Take 50,000 Units by mouth every Wednesday. 01/07/22   [provider]  hydrochlorothiazide (MICROZIDE) 12.5 MG capsule Take 12.5 mg by mouth at bedtime.    [provider]  ibuprofen  (ADVIL ) 800 MG tablet Take 1 tablet (800 mg total) by mouth every 6 (six) hours as needed. 11/21/23   Velma Ghazi, DPM  omeprazole (PRILOSEC) 20 MG capsule Take 20 mg by mouth at bedtime.    [provider]  ondansetron  (ZOFRAN ) 4 MG tablet Take 1 tablet (4 mg total) by mouth every 8 (eight) hours as needed. Patient not taking: Reported on 01/02/2024 10/06/23   Hyatt, Aundria Bloom T, DPM  oxyCODONE -acetaminophen  (PERCOCET) 10-325 MG tablet Take 1 tablet by mouth every 8 (eight) hours as needed for pain. Patient not taking: Reported on 01/02/2024    [provider]  oxyCODONE -acetaminophen  (PERCOCET) 5-325 MG tablet Take 1 tablet by mouth every 4 (four) hours as needed for severe pain (pain score 7-10). 11/21/23   Velma Ghazi, DPM  solifenacin (VESICARE) 10 MG tablet Take 10 mg by mouth at bedtime.    [provider]    Family History Family History  Problem Relation Age of Onset   Diabetes Mother     Social History Social History   Tobacco Use   Smoking status: Never   Smokeless tobacco: Never  Vaping Use   Vaping status: Never Used  Substance Use Topics   Alcohol use: Yes    Comment: Occasional   Drug use: No     Allergies   Cortisone, Silicone, and Tape   Review of Systems Review of Systems   Physical Exam Triage Vital Signs ED Triage Vitals [04/16/24 0842]  Encounter Vitals Group     BP 125/81     Systolic BP Percentile      Diastolic BP Percentile      Pulse Rate 76     Resp 18     Temp (!) 97.1 F (36.2 C)     Temp Source  Temporal     SpO2 98 %     Weight      Height      Head Circumference      Peak Flow      Pain Score 4     Pain Loc      Pain Education      Exclude from Growth Chart    No data found.  Updated Vital Signs BP 125/81 (BP Location: Left Arm)   Pulse 76   Temp (!) 97.1 F (36.2 C) (Temporal)   Resp 18   SpO2 98%   Visual Acuity Right Eye Distance:   Left Eye Distance:   Bilateral Distance:    Right Eye Near:   Left Eye Near:    Bilateral Near:     Physical Exam Constitutional:  Appearance: Normal appearance.  Eyes:     Extraocular Movements: Extraocular movements intact.  Pulmonary:     Effort: Pulmonary effort is normal.  Abdominal:     Tenderness: There is no abdominal tenderness. There is no right CVA tenderness, left CVA tenderness or guarding.  Neurological:     Mental Status: She is alert and oriented to person, place, and time.      UC Treatments / Results  Labs (all labs ordered are listed, but only abnormal results are displayed) Labs Reviewed  POCT URINALYSIS DIP (MANUAL ENTRY) - Abnormal; Notable for the following components:      Result Value   Color, UA orange (*)    Glucose, UA =100 (*)    Blood, UA trace-intact (*)    Nitrite, UA Positive (*)    Leukocytes, UA Trace (*)    All other components within normal limits  URINE CULTURE  CERVICOVAGINAL ANCILLARY ONLY    EKG   Radiology No results found.  Procedures Procedures (including critical care time)  Medications Ordered in UC Medications - No data to display  Initial Impression / Assessment and Plan / UC Course  I have reviewed the triage vital signs and the nursing notes.  Pertinent labs & imaging results that were available during my care of the patient were reviewed by me and considered in my medical decision making (see chart for details).  Dysuria, acute cystitis with hematuria  Urinalysis showing nitrates, sent for culture, discussed findings with patient, vaginal  swab checking for yeast and BV pending, will treat per protocol, prescribed Macrobid  and discussed additional supportive measures with follow-up as needed   Final Clinical Impressions(s) / UC Diagnoses   Final diagnoses:  Dysuria     Discharge Instructions      Your urinalysis shows Jasmine Franco blood cells and nitrates which are indicative of infection, your urine will be sent to the lab to determine exactly which bacteria is present, if any changes need to be made to your medications you will be notified  Swab checking for yeast and BV is pending 2 to 3 days, you will be notified of positive results only and at that time medication sent to pharmacy  Begin use of macrobid  twice daily for 5 days   You may use over-the-counter Azo to help minimize your symptoms until antibiotic removes bacteria, this medication will turn your urine orange  Increase your fluid intake through use of water  As always practice good hygiene, wiping front to back and avoidance of scented vaginal products to prevent further irritation  If symptoms continue to persist after use of medication or recur please follow-up with urgent care or your primary doctor as needed    ED Prescriptions     Medication Sig Dispense Auth. Provider   nitrofurantoin , macrocrystal-monohydrate, (MACROBID ) 100 MG capsule Take 1 capsule (100 mg total) by mouth 2 (two) times daily. 10 capsule Davidlee Jeanbaptiste R, NP      PDMP not reviewed this encounter.   Reena Canning, [place] 04/16/24 [phone]

## 2024-04-16 NOTE — ED Triage Notes (Signed)
 Patient presents to [facility] for evaluation after last night she had sudden onset of burning with urination and bright red blood.  Patient's daughter brought her AZO and she was able to sleep, but woke up at 2am and had "bright orange" urine.  Reassured patient this can be a side effect of the AZO.  She states she is having frequency, urgency and burning still, but better

## 2024-04-17 LAB — URINE CULTURE

## 2024-04-17 LAB — CERVICOVAGINAL ANCILLARY ONLY
Bacterial Vaginitis (gardnerella): NEGATIVE
Candida Glabrata: NEGATIVE
Candida Vaginitis: NEGATIVE
Comment: NEGATIVE
Comment: NEGATIVE
Comment: NEGATIVE

## 2024-04-19 ENCOUNTER — Ambulatory Visit: Admitting: Podiatry

## 2024-04-19 ENCOUNTER — Other Ambulatory Visit: Payer: Self-pay | Admitting: Podiatry

## 2024-04-19 ENCOUNTER — Ambulatory Visit (INDEPENDENT_AMBULATORY_CARE_PROVIDER_SITE_OTHER)

## 2024-04-19 DIAGNOSIS — M2011 Hallux valgus (acquired), right foot: Secondary | ICD-10-CM | POA: Diagnosis not present

## 2024-04-19 DIAGNOSIS — Z01818 Encounter for other preprocedural examination: Secondary | ICD-10-CM | POA: Diagnosis not present

## 2024-04-19 DIAGNOSIS — M960 Pseudarthrosis after fusion or arthrodesis: Secondary | ICD-10-CM

## 2024-04-19 NOTE — Progress Notes (Signed)
 Subjective:  Patient ID: Jasmine Franco, female    DOB: 1966-05-16,  MRN: [id number]  No chief complaint on file.   58 y.o. female presents with the above complaint.  Patient presents with continuous pain to the right first tarsometatarsal joint.  She states it is manageable but she is noticing more discomfort.  She wanted to discuss treatment options for this.  She would like to revise the surgery if possible.  She has been wearing the bone stimulator which has not helped much.  Denies any other acute complaints.   Review of Systems: Negative except as noted in the HPI. Denies N/V/F/Ch.  Past Medical History:  Diagnosis Date   Bunion of right foot 10/2023   Cancer (HCC) 2022   skin cancer on forehead   Cholelithiasis    Diuretic-induced hypokalemia 09/2023   Dyspnea    Essential hypertension    Excess skin of arm    bilateral arms since weight loss   GERD (gastroesophageal reflux disease)    Hammertoe of right foot 10/2023   4th and 5th digit   History of kidney stones    Hyperlipidemia    OAB (overactive bladder)    Obesity    Plantar fasciitis, bilateral    Pre-diabetes    Thrombocytopenia (HCC)    Vitamin D deficiency     Current Outpatient Medications:    buPROPion (WELLBUTRIN XL) 300 MG 24 hr tablet, Take 300 mg by mouth at bedtime., Disp: , Rfl:    cephALEXin  (KEFLEX ) 500 MG capsule, Take 1 capsule (500 mg total) by mouth 3 (three) times daily. (Patient not taking: Reported on 01/02/2024), Disp: 30 capsule, Rfl: 0   clobetasol ointment (TEMOVATE) 0.05 %, Apply 1 Application topically 2 (two) times daily as needed (rash)., Disp: , Rfl:    ergocalciferol (VITAMIN D2) 1.25 MG (50000 UT) capsule, Take 50,000 Units by mouth every Wednesday., Disp: , Rfl:    hydrochlorothiazide (MICROZIDE) 12.5 MG capsule, Take 12.5 mg by mouth at bedtime., Disp: , Rfl:    ibuprofen  (ADVIL ) 800 MG tablet, Take 1 tablet (800 mg total) by mouth every 6 (six) hours as needed., Disp: 60 tablet,  Rfl: 1   nitrofurantoin , macrocrystal-monohydrate, (MACROBID ) 100 MG capsule, Take 1 capsule (100 mg total) by mouth 2 (two) times daily., Disp: 10 capsule, Rfl: 0   omeprazole (PRILOSEC) 20 MG capsule, Take 20 mg by mouth at bedtime., Disp: , Rfl:    ondansetron  (ZOFRAN ) 4 MG tablet, Take 1 tablet (4 mg total) by mouth every 8 (eight) hours as needed. (Patient not taking: Reported on 01/02/2024), Disp: 20 tablet, Rfl: 0   oxyCODONE -acetaminophen  (PERCOCET) 10-325 MG tablet, Take 1 tablet by mouth every 8 (eight) hours as needed for pain. (Patient not taking: Reported on 01/02/2024), Disp: , Rfl:    oxyCODONE -acetaminophen  (PERCOCET) 5-325 MG tablet, Take 1 tablet by mouth every 4 (four) hours as needed for severe pain (pain score 7-10)., Disp: 30 tablet, Rfl: 0   solifenacin (VESICARE) 10 MG tablet, Take 10 mg by mouth at bedtime., Disp: , Rfl:   Social History   Tobacco Use  Smoking Status Never  Smokeless Tobacco Never    Allergies  Allergen Reactions   Cortisone Other (See Comments)    redness   Silicone Rash    Steri-strips postoperatively led to raised, punctate reddish lesions with mild drainage. Burning sensation    Tape Rash    Steri-strips postoperatively led to raised, punctate reddish lesions with mild drainage. Burning sensation  Objective:  There were no vitals filed for this visit. There is no height or weight on file to calculate BMI. Constitutional Well developed. Well nourished.  Vascular Dorsalis pedis pulses palpable bilaterally. Posterior tibial pulses palpable bilaterally. Capillary refill normal to all digits.  No cyanosis or clubbing noted. Pedal hair growth normal.  Neurologic Normal speech. Oriented to person, place, and time. Epicritic sensation to light touch grossly present bilaterally.  Dermatologic Nails well groomed and normal in appearance. No open wounds. No skin lesions.  Orthopedic: Pain on palpation right first tarsometatarsal joint.   Clinically able to appreciate the hardware.  No open wounds or lesion noted.  No erythema noted.   Radiographs: 3 views of the show mature adult right foot:  No consolidation noted.  Nonunion noted.  Hallux interphalangeus angle increase noted.  No arthritis noted at the first metatarsophalangeal joint no other abnormalities noted hardware is intact.  Subtle lucency noted around the proximal Assessment:   1. Nonunion after arthrodesis   2. Acquired hallux interphalangeus, right   3. Encounter for preoperative examination for general surgical procedure    Plan:  Patient was evaluated and treated and all questions answered.  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  She also has underlying hallux interphalangeus that would need to be addressed as well.  She would benefit from a proximal phalanx osteotomy while undergoing revisional.  I discussed this with the patient in extensive detail I discussed my preoperative intra postop plan with the patient extensive detail she states understand like to proceed with surgery -Informed surgical risk consent was reviewed and read aloud to the patient.  I reviewed the films.  I have discussed my findings with the patient in great detail.  I have discussed all risks including but not limited to infection, stiffness, scarring, limp, disability, deformity, damage to blood vessels and nerves, numbness, poor healing, need for braces, arthritis, chronic pain, amputation, death.  All benefits and realistic expectations discussed in great detail.  I have made no promises as to the outcome.  I have  provided realistic expectations.  I have offered the patient a 2nd opinion, which they have declined and assured me they preferred to proceed despite the risks   No follow-ups on file.   Right revisional Lapidus bunionectomy with phalangeal osteotomy with removal of hardware

## 2024-05-14 ENCOUNTER — Telehealth: Payer: Self-pay | Admitting: Podiatry

## 2024-05-14 NOTE — Telephone Encounter (Signed)
 DOS: 06/18/2024  (RT) LAPIDUS PROCEDURE INCLUDING BUNIONECTOMY -[phone]  (RT) REMOVAL FIXATION DEEP K -WIRE/ [id number]  (RT) Candida Chalk OSTEOTOMY-[id number]     EFFECTIVE DATE :   12/28/23  DEDUCTIBLE :  $1,500.00  REMAINING:   $1,088.25  OOP:  $4,500.00   REMAINING:  $3,872.34   COINSURANCE:  0%        PER THE CAREL[facility] NO PRIOR AUTH IS REQ FOR CPT CODES [id number]  REF# [id number]

## 2024-06-11 ENCOUNTER — Ambulatory Visit
Admission: EM | Admit: 2024-06-11 | Discharge: 2024-06-11 | Disposition: A | Discharge status: Home / Self Care | Attending: Family Medicine | Admitting: Family Medicine

## 2024-06-11 DIAGNOSIS — T148XXA Other injury of unspecified body region, initial encounter: Secondary | ICD-10-CM

## 2024-06-11 NOTE — ED Triage Notes (Signed)
 Pt presents to UC for c/o scratch on left leg 2 days ago. Pt states she scratched a scab off and it won't stop bleeding.

## 2024-06-11 NOTE — ED Provider Notes (Signed)
 Jasmine Franco    CSN: [id number] Arrival date & time: 06/11/24  4098      History   Chief Complaint No chief complaint on file.   HPI Jasmine Franco is a 58 y.o. female presents for leg wound.  Patient reports Saturday she scratched her left lower outer leg and began bleeding.  States it was spewing.  This caused her to call 911 and paramedics to come out and assess her.  She states they applied a dressing and told her to not remove it till the next day.  She states she did remove it yesterday and again it began spewing.  She reapplied the pressure dressing and decided to come in this morning for evaluation.  She denies any blood thinning medications.  Does report a history of eczema.  No other concerns at this time.  HPI  Past Medical History:  Diagnosis Date   Bunion of right foot 10/2023   Cancer (HCC) 2022   skin cancer on forehead   Cholelithiasis    Diuretic-induced hypokalemia 09/2023   Dyspnea    Essential hypertension    Excess skin of arm    bilateral arms since weight loss   GERD (gastroesophageal reflux disease)    Hammertoe of right foot 10/2023   4th and 5th digit   History of kidney stones    Hyperlipidemia    OAB (overactive bladder)    Obesity    Plantar fasciitis, bilateral    Pre-diabetes    Thrombocytopenia (HCC)    Vitamin D deficiency     Patient Active Problem List   Diagnosis Date Noted   Visual disturbance    Cough    AKI (acute kidney injury) (HCC)    Klebsiella sepsis (HCC)    Lactic acidosis    Thrombocytopenia (HCC)    Depression    Pyelonephritis 05/22/2021   Severe sepsis (HCC) 05/22/2021   Obesity, Class III, BMI 40-49.9 (morbid obesity) 05/22/2021   Acute cholecystitis 02/11/2021   Symptomatic cholelithiasis 02/10/2021   Cholecystitis    Bilateral low back pain without sciatica 12/08/2020   OAB (overactive bladder) 12/08/2020    Past Surgical History:  Procedure Laterality Date   Candida Chalk OSTEOTOMY Right  11/21/2023   Procedure: Candida Chalk OSTEOTOMY;  Surgeon: Velma Ghazi, DPM;  Location: [facility];  Service: Orthopedics/Podiatry;  Laterality: Right;   BUNIONECTOMY WITH HAMMERTOE RECONSTRUCTION AND GASTROC SLIDE Right 11/21/2023   Procedure: BUNIONECTOMY WITH HAMMERTOE RECONSTRUCTION AND GASTROC SLIDE;  Surgeon: Velma Ghazi, DPM;  Location: [facility];  Service: Orthopedics/Podiatry;  Laterality: Right;   CHOLECYSTECTOMY  01/2021   COLONOSCOPY WITH PROPOFOL  N/A 11/10/2021   Procedure: COLONOSCOPY WITH PROPOFOL ;  Surgeon: Shane Darling, MD;  Location: [facility];  Service: Endoscopy;  Laterality: N/A;   ESOPHAGOGASTRODUODENOSCOPY (EGD) WITH PROPOFOL  N/A 11/10/2021   Procedure: ESOPHAGOGASTRODUODENOSCOPY (EGD) WITH PROPOFOL ;  Surgeon: Shane Darling, MD;  Location: [facility];  Service: Endoscopy;  Laterality: N/A;   HALLUX VALGUS LAPIDUS Right 11/21/2023   Procedure: HALLUX VALGUS LAPIDUS;  Surgeon: Velma Ghazi, DPM;  Location: [facility];  Service: Orthopedics/Podiatry;  Laterality: Right;  POPLITEAL BLOCK   PRE-MALIGNANT / BENIGN SKIN LESION EXCISION     REVERSE SHOULDER ARTHROPLASTY Left 06/22/2021   Procedure: REVERSE SHOULDER ARTHROPLASTY, biceps tenodesis - RNFA needed;  Surgeon: Lorri Rota, MD;  Location: [facility];  Service: Orthopedics;  Laterality: Left;   SHOULDER ARTHROSCOPY WITH SUBACROMIAL DECOMPRESSION AND OPEN ROTATOR C Right 10/05/2021   Procedure: Right shoulder arthroscopic  rotator cuff repair (subscapularis & supraspinatus) with subacromial decompression, and athroscopic biceps tenodesis;  Surgeon: Lorri Rota, MD;  Location: [facility];  Service: Orthopedics;  Laterality: Right;    OB History   No obstetric history on file.      Home Medications    Prior to Admission medications   Medication Sig Start Date End Date Taking? Authorizing Provider  buPROPion (WELLBUTRIN XL) 300 MG 24 hr tablet Take 300 mg by mouth at bedtime. 04/04/22   [provider]  cephALEXin  (KEFLEX ) 500 MG capsule Take 1 capsule (500 mg total) by mouth 3 (three) times daily. Patient not taking: Reported on 01/02/2024 10/06/23   Hyatt, Max T, DPM  clobetasol ointment (TEMOVATE) 0.05 % Apply 1 Application topically 2 (two) times daily as needed (rash). 09/09/23   [provider]  ergocalciferol (VITAMIN D2) 1.25 MG (50000 UT) capsule Take 50,000 Units by mouth every Wednesday. 01/07/22   [provider]  hydrochlorothiazide (MICROZIDE) 12.5 MG capsule Take 12.5 mg by mouth at bedtime.    [provider]  ibuprofen  (ADVIL ) 800 MG tablet Take 1 tablet (800 mg total) by mouth every 6 (six) hours as needed. 11/21/23   Velma Ghazi, DPM  nitrofurantoin , macrocrystal-monohydrate, (MACROBID ) 100 MG capsule Take 1 capsule (100 mg total) by mouth 2 (two) times daily. 04/16/24   Reena Canning, NP  omeprazole (PRILOSEC) 20 MG capsule Take 20 mg by mouth at bedtime.    [provider]  ondansetron  (ZOFRAN ) 4 MG tablet Take 1 tablet (4 mg total) by mouth every 8 (eight) hours as needed. Patient not taking: Reported on 01/02/2024 10/06/23   Hyatt, Max T, DPM  oxyCODONE -acetaminophen  (PERCOCET) 10-325 MG tablet Take 1 tablet by mouth every 8 (eight) hours as needed for pain. Patient not taking: Reported on 01/02/2024    [provider]  oxyCODONE -acetaminophen  (PERCOCET) 5-325 MG tablet Take 1 tablet by mouth every 4 (four) hours as needed for severe pain (pain score 7-10). 11/21/23   Velma Ghazi, DPM  solifenacin (VESICARE) 10 MG tablet Take 10 mg by mouth at bedtime.    [provider]    Family History Family History  Problem Relation Age of Onset   Diabetes Mother     Social History Social History   Tobacco Use   Smoking status: Never   Smokeless tobacco: Never  Vaping Use   Vaping status: Never Used  Substance Use Topics   Alcohol use: Yes    Comment: Occasional   Drug use: No     Allergies    Cortisone, Silicone, and Tape   Review of Systems Review of Systems  Skin:  Positive for wound.     Physical Exam Triage Vital Signs ED Triage Vitals  Encounter Vitals Group     BP 06/11/24 0810 132/84     Girls Systolic BP Percentile --      Girls Diastolic BP Percentile --      Boys Systolic BP Percentile --      Boys Diastolic BP Percentile --      Pulse Rate 06/11/24 0810 81     Resp --      Temp 06/11/24 0810 98.1 F (36.7 C)     Temp Source 06/11/24 0810 Oral     SpO2 06/11/24 0810 100 %     Weight --      Height --      Head Circumference --      Peak Flow --  Pain Score 06/11/24 0808 0     Pain Loc --      Pain Education --      Exclude from Growth Chart --    No data found.  Updated Vital Signs BP 132/84 (BP Location: Left Arm)   Pulse 81   Temp 98.1 F (36.7 C) (Oral)   SpO2 100%   Visual Acuity Right Eye Distance:   Left Eye Distance:   Bilateral Distance:    Right Eye Near:   Left Eye Near:    Bilateral Near:     Physical Exam Vitals and nursing note reviewed.  Constitutional:      General: She is not in acute distress.    Appearance: Normal appearance. She is not ill-appearing.  HENT:     Head: Normocephalic and atraumatic.   Eyes:     Pupils: Pupils are equal, round, and reactive to light.    Cardiovascular:     Rate and Rhythm: Normal rate.  Pulmonary:     Effort: Pulmonary effort is normal.   Skin:    General: Skin is warm and dry.         Comments: There are several scabbed superficial abrasions to the left lower outer leg.  There is no swelling, erythema, warmth, or active bleeding.  Varicosities noted on leg.  See photo   Neurological:     General: No focal deficit present.     Mental Status: She is alert and oriented to person, place, and time.   Psychiatric:        Mood and Affect: Mood normal.        Behavior: Behavior normal.      UC Treatments / Results  Labs (all labs ordered are listed, but only  abnormal results are displayed) Labs Reviewed - No data to display  EKG   Radiology No results found.  Procedures Procedures (including critical care time)  Medications Ordered in UC Medications - No data to display  Initial Impression / Assessment and Plan / UC Course  I have reviewed the triage vital signs and the nursing notes.  Pertinent labs & imaging results that were available during my care of the patient were reviewed by me and considered in my medical decision making (see chart for details).     Reviewed exam and symptoms with patient.  No red flag.  Wound was left uncovered for 15 minutes without any signs of bleeding.  Wound was cleansed and redressed by nursing staff.  Discussed wound care/signs and symptoms of infection.  Advised PCP follow-up if symptoms do not improve.  ER precautions reviewed. Final Clinical Impressions(s) / UC Diagnoses   Final diagnoses:  Abrasion of skin     Discharge Instructions      Continue to keep area clean and dry.  Keep dressing in place and follow-up with your PCP if your symptoms do not improve.  Please go to the ER for any worsening symptoms.  Hope you feel better soon!     ED Prescriptions   None    PDMP not reviewed this encounter.   Alleen Arbour, NP 06/11/24 [phone]

## 2024-06-11 NOTE — Discharge Instructions (Signed)
 Continue to keep area clean and dry.  Keep dressing in place and follow-up with your PCP if your symptoms do not improve.  Please go to the ER for any worsening symptoms.  Hope you feel better soon!

## 2024-06-14 ENCOUNTER — Telehealth: Payer: Self-pay | Admitting: Podiatry

## 2024-06-14 NOTE — Telephone Encounter (Signed)
 Received call from cynthia at the surgery center and she spoke to pts daughter Pattie Borders and she said she did not think pt is going to have the surgery for financial reasons.  I left message for pt and her daughter to call me asap to let me know so I can get the surgery canceled. I have to speak to pt directly.

## 2024-06-14 NOTE — Telephone Encounter (Signed)
 Pt called back and states she had not heard anything about the surgery date until today and that the surgery is a redo and she cannot afford to pay for another one. She thought since Dr Lydia Sams is redoing the surgery from last time she would not have to pay.  I told pt Dr Lydia Sams is out of the office until Monday and that we should probably postpone the surgery until we can discuss with Dr Lydia Sams.Pt was in agreement.  I called and canceled surgery with cynthia at the surgery center for now.

## 2024-06-22 NOTE — Telephone Encounter (Signed)
 Called and left message that if pt wanted the surgery could be done at the hospital and they do not require pre payment.  Dr Tobie said that he could possibly waive his fees but the facility and the anesthesia dept will still bill for the procedure. Dr Tobie said its not an urgent surgery that if pt wanted to wait she could do that.   Asked pt to call to let me know if she wants to proceed with the surgery at the hospital.

## 2024-06-26 ENCOUNTER — Encounter: Admitting: Podiatry

## 2024-07-10 ENCOUNTER — Encounter: Admitting: Podiatry

## 2024-11-06 ENCOUNTER — Other Ambulatory Visit: Payer: Self-pay | Admitting: Cardiology

## 2024-11-06 DIAGNOSIS — R9439 Abnormal result of other cardiovascular function study: Secondary | ICD-10-CM

## 2024-11-06 DIAGNOSIS — R0609 Other forms of dyspnea: Secondary | ICD-10-CM

## 2024-11-06 DIAGNOSIS — E669 Obesity, unspecified: Secondary | ICD-10-CM

## 2024-11-06 DIAGNOSIS — I10 Essential (primary) hypertension: Secondary | ICD-10-CM

## 2024-11-12 ENCOUNTER — Encounter (HOSPITAL_COMMUNITY): Payer: Self-pay

## 2024-11-15 ENCOUNTER — Ambulatory Visit
Admission: RE | Admit: 2024-11-15 | Discharge: 2024-11-15 | Disposition: A | Discharge status: Home / Self Care | Source: Ambulatory Visit | Attending: Cardiology | Admitting: Cardiology

## 2024-11-15 DIAGNOSIS — E669 Obesity, unspecified: Secondary | ICD-10-CM | POA: Diagnosis present

## 2024-11-15 DIAGNOSIS — I10 Essential (primary) hypertension: Secondary | ICD-10-CM | POA: Insufficient documentation

## 2024-11-15 DIAGNOSIS — R9439 Abnormal result of other cardiovascular function study: Secondary | ICD-10-CM | POA: Insufficient documentation

## 2024-11-15 DIAGNOSIS — R0609 Other forms of dyspnea: Secondary | ICD-10-CM | POA: Diagnosis present

## 2024-11-15 MED ORDER — NITROGLYCERIN 0.4 MG SL SUBL
0.8000 mg | SUBLINGUAL_TABLET | Freq: Once | SUBLINGUAL | Status: AC
  Administered 2024-11-15: 0.8 mg via SUBLINGUAL
  Filled 2024-11-15: qty 25

## 2024-11-15 MED ORDER — IOHEXOL 350 MG/ML SOLN
100.0000 mL | Freq: Once | INTRAVENOUS | Status: AC | PRN
  Administered 2024-11-15: 100 mL via INTRAVENOUS

## 2024-11-15 MED ORDER — METOPROLOL TARTRATE 5 MG/5ML IV SOLN
10.0000 mg | Freq: Once | INTRAVENOUS | Status: DC | PRN
  Filled 2024-11-15: qty 10

## 2024-11-15 MED ORDER — DILTIAZEM HCL 25 MG/5ML IV SOLN
10.0000 mg | INTRAVENOUS | Status: DC | PRN
  Filled 2024-11-15: qty 5

## 2024-11-15 NOTE — Progress Notes (Signed)
 Patient tolerated CT well. Vitals stable. Patient encouraged to drink fluids throughout the shift.
